# Patient Record
Sex: Female | Born: 2000 | Race: White | Hispanic: No | Marital: Single | Smoking: Current every day smoker
Health system: Southern US, Community
[De-identification: ages and names within clinical notes are randomized; demographics above are authoritative.]

---
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
## 2021-01-17 DIAGNOSIS — Z419 Encounter for procedure for purposes other than remedying health state, unspecified: Secondary | ICD-10-CM | POA: Diagnosis not present

## 2021-02-10 DIAGNOSIS — M791 Myalgia, unspecified site: Secondary | ICD-10-CM | POA: Diagnosis not present

## 2021-02-10 DIAGNOSIS — J069 Acute upper respiratory infection, unspecified: Secondary | ICD-10-CM | POA: Diagnosis not present

## 2021-02-10 DIAGNOSIS — R6883 Chills (without fever): Secondary | ICD-10-CM | POA: Diagnosis not present

## 2021-02-13 ENCOUNTER — Other Ambulatory Visit: Payer: Self-pay

## 2021-02-13 ENCOUNTER — Encounter (HOSPITAL_BASED_OUTPATIENT_CLINIC_OR_DEPARTMENT_OTHER): Payer: Self-pay | Admitting: *Deleted

## 2021-02-13 ENCOUNTER — Inpatient Hospital Stay (HOSPITAL_BASED_OUTPATIENT_CLINIC_OR_DEPARTMENT_OTHER)
Admission: EM | Admit: 2021-02-13 | Discharge: 2021-02-17 | DRG: 871 | Disposition: A | Discharge status: Home / Self Care | Payer: Medicaid Other | Attending: Internal Medicine | Admitting: Internal Medicine

## 2021-02-13 ENCOUNTER — Emergency Department (HOSPITAL_BASED_OUTPATIENT_CLINIC_OR_DEPARTMENT_OTHER): Payer: Medicaid Other

## 2021-02-13 DIAGNOSIS — F17210 Nicotine dependence, cigarettes, uncomplicated: Secondary | ICD-10-CM | POA: Diagnosis present

## 2021-02-13 DIAGNOSIS — E876 Hypokalemia: Secondary | ICD-10-CM | POA: Diagnosis not present

## 2021-02-13 DIAGNOSIS — Z20822 Contact with and (suspected) exposure to covid-19: Secondary | ICD-10-CM | POA: Diagnosis not present

## 2021-02-13 DIAGNOSIS — J9601 Acute respiratory failure with hypoxia: Secondary | ICD-10-CM | POA: Diagnosis not present

## 2021-02-13 DIAGNOSIS — R6521 Severe sepsis with septic shock: Secondary | ICD-10-CM | POA: Diagnosis not present

## 2021-02-13 DIAGNOSIS — R1031 Right lower quadrant pain: Secondary | ICD-10-CM | POA: Diagnosis not present

## 2021-02-13 DIAGNOSIS — E8721 Acute metabolic acidosis: Secondary | ICD-10-CM | POA: Diagnosis not present

## 2021-02-13 DIAGNOSIS — N10 Acute tubulo-interstitial nephritis: Secondary | ICD-10-CM | POA: Diagnosis not present

## 2021-02-13 DIAGNOSIS — B9620 Unspecified Escherichia coli [E. coli] as the cause of diseases classified elsewhere: Secondary | ICD-10-CM | POA: Diagnosis present

## 2021-02-13 DIAGNOSIS — D649 Anemia, unspecified: Secondary | ICD-10-CM | POA: Diagnosis not present

## 2021-02-13 DIAGNOSIS — F419 Anxiety disorder, unspecified: Secondary | ICD-10-CM | POA: Diagnosis present

## 2021-02-13 DIAGNOSIS — A419 Sepsis, unspecified organism: Secondary | ICD-10-CM | POA: Diagnosis not present

## 2021-02-13 DIAGNOSIS — N151 Renal and perinephric abscess: Secondary | ICD-10-CM | POA: Diagnosis present

## 2021-02-13 DIAGNOSIS — N12 Tubulo-interstitial nephritis, not specified as acute or chronic: Secondary | ICD-10-CM

## 2021-02-13 DIAGNOSIS — Z79890 Hormone replacement therapy (postmenopausal): Secondary | ICD-10-CM | POA: Diagnosis not present

## 2021-02-13 DIAGNOSIS — R0602 Shortness of breath: Secondary | ICD-10-CM

## 2021-02-13 DIAGNOSIS — E872 Acidosis, unspecified: Secondary | ICD-10-CM | POA: Diagnosis not present

## 2021-02-13 DIAGNOSIS — E860 Dehydration: Secondary | ICD-10-CM | POA: Diagnosis not present

## 2021-02-13 DIAGNOSIS — N3000 Acute cystitis without hematuria: Secondary | ICD-10-CM

## 2021-02-13 DIAGNOSIS — Z87440 Personal history of urinary (tract) infections: Secondary | ICD-10-CM | POA: Diagnosis not present

## 2021-02-13 DIAGNOSIS — R059 Cough, unspecified: Secondary | ICD-10-CM | POA: Diagnosis not present

## 2021-02-13 DIAGNOSIS — Z419 Encounter for procedure for purposes other than remedying health state, unspecified: Secondary | ICD-10-CM | POA: Diagnosis not present

## 2021-02-13 DIAGNOSIS — N179 Acute kidney failure, unspecified: Secondary | ICD-10-CM | POA: Diagnosis not present

## 2021-02-13 DIAGNOSIS — R509 Fever, unspecified: Secondary | ICD-10-CM

## 2021-02-13 LAB — LIPASE, BLOOD: Lipase: <10 U/L — ABNORMAL LOW (ref 11–51)

## 2021-02-13 LAB — COMPREHENSIVE METABOLIC PANEL
ALT: 19 U/L (ref 0–44)
AST: 19 U/L (ref 15–41)
Albumin: 3.7 g/dL (ref 3.5–5.0)
Alkaline Phosphatase: 57 U/L (ref 38–126)
Anion gap: 13 (ref 5–15)
BUN: 16 mg/dL (ref 6–20)
CO2: 22 mmol/L (ref 22–32)
Calcium: 9.4 mg/dL (ref 8.9–10.3)
Chloride: 97 mmol/L — ABNORMAL LOW (ref 98–111)
Creatinine, Ser: 1.28 mg/dL — ABNORMAL HIGH (ref 0.44–1.00)
GFR, Estimated: >60 mL/min (ref >60)
Glucose, Bld: 89 mg/dL (ref 70–99)
Potassium: 4 mmol/L (ref 3.5–5.1)
Sodium: 132 mmol/L — ABNORMAL LOW (ref 135–145)
Total Bilirubin: 0.5 mg/dL (ref 0.3–1.2)
Total Protein: 7 g/dL (ref 6.5–8.1)

## 2021-02-13 LAB — CBC WITH DIFFERENTIAL/PLATELET
Abs Immature Granulocytes: 0.15 10*3/uL — ABNORMAL HIGH (ref 0.00–0.07)
Basophils Absolute: 0.2 10*3/uL — ABNORMAL HIGH (ref 0.0–0.1)
Basophils Relative: 1 %
Eosinophils Absolute: 0 10*3/uL (ref 0.0–0.5)
Eosinophils Relative: 0 %
HCT: 36.7 % (ref 36.0–46.0)
Hemoglobin: 12.4 g/dL (ref 12.0–15.0)
Immature Granulocytes: 1 %
Lymphocytes Relative: 4 %
Lymphs Abs: 0.9 10*3/uL (ref 0.7–4.0)
MCH: 29.7 pg (ref 26.0–34.0)
MCHC: 33.8 g/dL (ref 30.0–36.0)
MCV: 87.8 fL (ref 80.0–100.0)
Monocytes Absolute: 2.2 10*3/uL — ABNORMAL HIGH (ref 0.1–1.0)
Monocytes Relative: 10 %
Neutro Abs: 18.2 10*3/uL — ABNORMAL HIGH (ref 1.7–7.7)
Neutrophils Relative %: 84 %
Platelets: 227 10*3/uL (ref 150–400)
RBC: 4.18 MIL/uL (ref 3.87–5.11)
RDW: 12.6 % (ref 11.5–15.5)
WBC: 21.5 10*3/uL — ABNORMAL HIGH (ref 4.0–10.5)
nRBC: 0 % (ref 0.0–0.2)

## 2021-02-13 LAB — URINALYSIS, ROUTINE W REFLEX MICROSCOPIC
Bilirubin Urine: NEGATIVE
Glucose, UA: NEGATIVE mg/dL
Ketones, ur: NEGATIVE mg/dL
Nitrite: NEGATIVE
Protein, ur: 100 mg/dL — AB
Specific Gravity, Urine: 1.018 (ref 1.005–1.030)
WBC, UA: >50 WBC/hpf — ABNORMAL HIGH (ref 0–5)
pH: 5.5 (ref 5.0–8.0)

## 2021-02-13 LAB — PREGNANCY, URINE: Preg Test, Ur: NEGATIVE

## 2021-02-13 LAB — RESP PANEL BY RT-PCR (FLU A&B, COVID) ARPGX2
Influenza A by PCR: NEGATIVE
Influenza B by PCR: NEGATIVE
SARS Coronavirus 2 by RT PCR: NEGATIVE

## 2021-02-13 LAB — LACTIC ACID, PLASMA
Lactic Acid, Venous: 1.1 mmol/L (ref 0.5–1.9)
Lactic Acid, Venous: 1.2 mmol/L (ref 0.5–1.9)

## 2021-02-13 LAB — MRSA NEXT GEN BY PCR, NASAL: MRSA by PCR Next Gen: NOT DETECTED

## 2021-02-13 LAB — HIV ANTIBODY (ROUTINE TESTING W REFLEX): HIV Screen 4th Generation wRfx: NONREACTIVE

## 2021-02-13 LAB — PROCALCITONIN: Procalcitonin: 11.31 ng/mL

## 2021-02-13 MED ORDER — MELATONIN 3 MG PO TABS
ORAL_TABLET | ORAL | Status: AC
  Filled 2021-02-13: qty 1

## 2021-02-13 MED ORDER — SODIUM CHLORIDE 0.9 % IV BOLUS
1000.0000 mL | Freq: Once | INTRAVENOUS | Status: AC
  Administered 2021-02-13: 14:00:00 1000 mL via INTRAVENOUS

## 2021-02-13 MED ORDER — ENSURE ENLIVE PO LIQD
237.0000 mL | Freq: Two times a day (BID) | ORAL | Status: DC
  Administered 2021-02-14: 237 mL via ORAL

## 2021-02-13 MED ORDER — ACETAMINOPHEN 325 MG PO TABS
650.0000 mg | ORAL_TABLET | Freq: Four times a day (QID) | ORAL | Status: DC | PRN
  Administered 2021-02-13 – 2021-02-14 (×4): 650 mg via ORAL
  Filled 2021-02-13 (×4): qty 2

## 2021-02-13 MED ORDER — KETOROLAC TROMETHAMINE 30 MG/ML IJ SOLN: 30.0000 mg | Freq: Four times a day (QID) | INTRAMUSCULAR | Status: DC | PRN

## 2021-02-13 MED ORDER — SORBITOL 70 % SOLN
30.0000 mL | Freq: Every day | Status: DC | PRN
  Filled 2021-02-13: qty 30

## 2021-02-13 MED ORDER — IOHEXOL 300 MG/ML  SOLN
75.0000 mL | Freq: Once | INTRAMUSCULAR | Status: AC | PRN
  Administered 2021-02-13: 13:00:00 75 mL via INTRAVENOUS

## 2021-02-13 MED ORDER — LACTATED RINGERS IV BOLUS (SEPSIS)
500.0000 mL | Freq: Once | INTRAVENOUS | Status: AC
  Administered 2021-02-13: 20:00:00 500 mL via INTRAVENOUS

## 2021-02-13 MED ORDER — SODIUM CHLORIDE 0.9 % IV BOLUS
1000.0000 mL | Freq: Once | INTRAVENOUS | Status: AC
  Administered 2021-02-13: 12:00:00 1000 mL via INTRAVENOUS

## 2021-02-13 MED ORDER — OXYCODONE HCL 5 MG PO TABS
5.0000 mg | ORAL_TABLET | ORAL | Status: DC | PRN
  Administered 2021-02-15: 5 mg via ORAL
  Filled 2021-02-13: qty 1

## 2021-02-13 MED ORDER — CHLORHEXIDINE GLUCONATE CLOTH 2 % EX PADS
6.0000 | MEDICATED_PAD | Freq: Every day | CUTANEOUS | Status: DC
  Administered 2021-02-13 – 2021-02-17 (×5): 6 via TOPICAL

## 2021-02-13 MED ORDER — IBUPROFEN 200 MG PO TABS
400.0000 mg | ORAL_TABLET | Freq: Four times a day (QID) | ORAL | Status: DC | PRN
  Administered 2021-02-13 – 2021-02-16 (×6): 400 mg via ORAL
  Filled 2021-02-13 (×5): qty 2
  Filled 2021-02-13: qty 1

## 2021-02-13 MED ORDER — LEVONORGESTREL-ETHINYL ESTRAD 0.1-20 MG-MCG PO TABS
1.0000 | ORAL_TABLET | Freq: Every morning | ORAL | Status: DC
  Administered 2021-02-15: 1 via ORAL

## 2021-02-13 MED ORDER — VANCOMYCIN HCL 1250 MG/250ML IV SOLN
1250.0000 mg | Freq: Once | INTRAVENOUS | Status: AC
  Administered 2021-02-13: 22:00:00 1250 mg via INTRAVENOUS
  Filled 2021-02-13 (×2): qty 250

## 2021-02-13 MED ORDER — SENNA 8.6 MG PO TABS
1.0000 | ORAL_TABLET | Freq: Two times a day (BID) | ORAL | Status: DC
  Administered 2021-02-13 – 2021-02-17 (×5): 8.6 mg via ORAL
  Filled 2021-02-13 (×7): qty 1

## 2021-02-13 MED ORDER — ENOXAPARIN SODIUM 40 MG/0.4ML IJ SOSY
40.0000 mg | PREFILLED_SYRINGE | INTRAMUSCULAR | Status: DC
  Administered 2021-02-13 – 2021-02-16 (×4): 40 mg via SUBCUTANEOUS
  Filled 2021-02-13 (×4): qty 0.4

## 2021-02-13 MED ORDER — SODIUM CHLORIDE 0.9 % IV SOLN
2.0000 g | Freq: Two times a day (BID) | INTRAVENOUS | Status: DC
  Administered 2021-02-13: 21:00:00 2 g via INTRAVENOUS
  Filled 2021-02-13 (×3): qty 2

## 2021-02-13 MED ORDER — ONDANSETRON HCL 4 MG PO TABS: 4.0000 mg | ORAL_TABLET | Freq: Four times a day (QID) | ORAL | Status: DC | PRN

## 2021-02-13 MED ORDER — FLEET ENEMA 7-19 GM/118ML RE ENEM: 1.0000 | ENEMA | Freq: Once | RECTAL | Status: DC | PRN

## 2021-02-13 MED ORDER — SODIUM CHLORIDE 0.9 % IV SOLN: 2.0000 g | INTRAVENOUS | Status: DC

## 2021-02-13 MED ORDER — ACETAMINOPHEN 650 MG RE SUPP: 650.0000 mg | Freq: Four times a day (QID) | RECTAL | Status: DC | PRN

## 2021-02-13 MED ORDER — LACTATED RINGERS IV BOLUS (SEPSIS)
250.0000 mL | Freq: Once | INTRAVENOUS | Status: AC
  Administered 2021-02-13: 21:00:00 250 mL via INTRAVENOUS

## 2021-02-13 MED ORDER — SODIUM CHLORIDE 0.9% FLUSH
3.0000 mL | Freq: Two times a day (BID) | INTRAVENOUS | Status: DC
  Administered 2021-02-15 – 2021-02-17 (×5): 3 mL via INTRAVENOUS

## 2021-02-13 MED ORDER — VANCOMYCIN HCL 500 MG/100ML IV SOLN
500.0000 mg | Freq: Two times a day (BID) | INTRAVENOUS | Status: DC
  Filled 2021-02-13: qty 100

## 2021-02-13 MED ORDER — POLYETHYLENE GLYCOL 3350 17 G PO PACK: 17.0000 g | PACK | Freq: Every day | ORAL | Status: DC | PRN

## 2021-02-13 MED ORDER — SODIUM CHLORIDE 0.9 % IV SOLN
1.0000 g | Freq: Once | INTRAVENOUS | Status: AC
  Administered 2021-02-13: 13:00:00 1 g via INTRAVENOUS
  Filled 2021-02-13: qty 10

## 2021-02-13 MED ORDER — METOPROLOL TARTRATE 5 MG/5ML IV SOLN: 5.0000 mg | Freq: Four times a day (QID) | INTRAVENOUS | Status: DC | PRN

## 2021-02-13 MED ORDER — ONDANSETRON HCL 4 MG/2ML IJ SOLN
4.0000 mg | Freq: Four times a day (QID) | INTRAMUSCULAR | Status: DC | PRN
  Administered 2021-02-15: 4 mg via INTRAVENOUS
  Filled 2021-02-13: qty 2

## 2021-02-13 MED ORDER — MELATONIN 3 MG PO TABS
3.0000 mg | ORAL_TABLET | Freq: Every day | ORAL | Status: DC
  Administered 2021-02-13 – 2021-02-16 (×4): 3 mg via ORAL
  Filled 2021-02-13 (×3): qty 1

## 2021-02-13 MED ORDER — SODIUM CHLORIDE 0.9 % IV SOLN: INTRAVENOUS | Status: DC

## 2021-02-13 MED ORDER — ACETAMINOPHEN 325 MG PO TABS
650.0000 mg | ORAL_TABLET | Freq: Once | ORAL | Status: AC
  Administered 2021-02-13: 11:00:00 650 mg via ORAL
  Filled 2021-02-13: qty 2

## 2021-02-13 MED ORDER — LACTATED RINGERS IV BOLUS (SEPSIS)
1000.0000 mL | Freq: Once | INTRAVENOUS | Status: AC
  Administered 2021-02-13: 19:00:00 1000 mL via INTRAVENOUS

## 2021-02-13 NOTE — ED Triage Notes (Signed)
Fever, headache, neck and back pain, occ nausea after taking meds.

## 2021-02-13 NOTE — ED Notes (Signed)
[facility] on unit to transport pt to [place] 

## 2021-02-13 NOTE — ED Provider Notes (Signed)
[facility] EMERGENCY DEPT Provider Note   CSN: [id number] Arrival date & time: 02/13/21  [id number]     History Chief Complaint  Patient presents with   Fever    Monica Goodman is a 20 y.o. female.  The history is provided by the patient.  Fever Temp source:  Subjective Severity:  Mild Onset quality:  Gradual Duration:  6 days Timing:  Intermittent Progression:  Waxing and waning Chronicity:  New Relieved by:  Ibuprofen Worsened by:  Nothing Associated symptoms: dysuria, headaches and myalgias   Associated symptoms: no chest pain, no chills, no confusion, no congestion, no cough, no diarrhea, no ear pain, no nausea, no rash, no sore throat and no vomiting   Risk factors: no sick contacts       History reviewed. No pertinent past medical history.  There are no problems to display for this patient.   History reviewed. No pertinent surgical history.   OB History   No obstetric history on file.     No family history on file.  Social History   Tobacco Use   Smoking status: Every Day    Types: Cigarettes   Smokeless tobacco: Never  Vaping Use   Vaping Use: Every day  Substance Use Topics   Alcohol use: Yes   Drug use: Never    Home Medications Prior to Admission medications   Not on File    Allergies    Patient has no known allergies.  Review of Systems   Review of Systems  Constitutional:  Positive for fever. Negative for chills.  HENT:  Negative for congestion, ear pain and sore throat.   Eyes:  Negative for pain and visual disturbance.  Respiratory:  Negative for cough and shortness of breath.   Cardiovascular:  Negative for chest pain and palpitations.  Gastrointestinal:  Positive for abdominal pain. Negative for diarrhea, nausea and vomiting.  Genitourinary:  Positive for dysuria. Negative for decreased urine volume, difficulty urinating, dyspareunia, enuresis, flank pain, frequency, genital sores, hematuria, pelvic pain, urgency,  vaginal discharge and vaginal pain.  Musculoskeletal:  Positive for myalgias. Negative for arthralgias and back pain.  Skin:  Negative for color change and rash.  Neurological:  Positive for headaches. Negative for seizures and syncope.  Psychiatric/Behavioral:  Negative for confusion.   All other systems reviewed and are negative.  Physical Exam Updated Vital Signs  ED Triage Vitals  Enc Vitals Group     BP 02/13/21 1032 102/60     Pulse Rate 02/13/21 1032 (!) 120     Resp 02/13/21 1032 12     Temp 02/13/21 1032 99 F (37.2 C)     Temp src --      SpO2 02/13/21 1032 99 %     Weight 02/13/21 1035 123 lb (55.8 kg)     Height 02/13/21 1035 5\' 2"  (1.575 m)     Head Circumference --      Peak Flow --      Pain Score 02/13/21 1035 7     Pain Loc --      Pain Edu? --      Excl. in [place]? --      Physical Exam Vitals and nursing note reviewed.  Constitutional:      General: She is not in acute distress.    Appearance: She is well-developed. She is not ill-appearing.  HENT:     Head: Normocephalic and atraumatic.     Nose: Congestion present. No rhinorrhea.     Mouth/Throat:  Mouth: Mucous membranes are moist.     Pharynx: No oropharyngeal exudate or posterior oropharyngeal erythema.  Eyes:     Extraocular Movements: Extraocular movements intact.     Conjunctiva/sclera: Conjunctivae normal.     Pupils: Pupils are equal, round, and reactive to light.  Cardiovascular:     Rate and Rhythm: Normal rate and regular rhythm.     Pulses: Normal pulses.     Heart sounds: Normal heart sounds. No murmur heard. Pulmonary:     Effort: Pulmonary effort is normal. No respiratory distress.     Breath sounds: Normal breath sounds.  Abdominal:     Palpations: Abdomen is soft.     Tenderness: There is abdominal tenderness (RLQ, sprapubic, right flank).  Musculoskeletal:        General: No swelling or tenderness.     Cervical back: Normal range of motion and neck supple.  Skin:     General: Skin is warm and dry.     Capillary Refill: Capillary refill takes less than 2 seconds.  Neurological:     General: No focal deficit present.     Mental Status: She is alert.  Psychiatric:        Mood and Affect: Mood normal.    ED Results / Procedures / Treatments   Labs (all labs ordered are listed, but only abnormal results are displayed) Labs Reviewed  URINALYSIS, ROUTINE W REFLEX MICROSCOPIC - Abnormal; Notable for the following components:      Result Value   APPearance CLOUDY (*)    Hgb urine dipstick MODERATE (*)    Protein, ur 100 (*)    Leukocytes,Ua LARGE (*)    WBC, UA >50 (*)    Bacteria, UA MANY (*)    All other components within normal limits  CBC WITH DIFFERENTIAL/PLATELET - Abnormal; Notable for the following components:   WBC 21.5 (*)    Neutro Abs 18.2 (*)    Monocytes Absolute 2.2 (*)    Basophils Absolute 0.2 (*)    Abs Immature Granulocytes 0.15 (*)    All other components within normal limits  COMPREHENSIVE METABOLIC PANEL - Abnormal; Notable for the following components:   Sodium 132 (*)    Chloride 97 (*)    Creatinine, Ser 1.28 (*)    All other components within normal limits  LIPASE, BLOOD - Abnormal; Notable for the following components:   Lipase <10 (*)    All other components within normal limits  RESP PANEL BY RT-PCR (FLU A&B, COVID) ARPGX2  URINE CULTURE  CULTURE, BLOOD (ROUTINE X 2)  CULTURE, BLOOD (ROUTINE X 2)  PREGNANCY, URINE  LACTIC ACID, PLASMA  LACTIC ACID, PLASMA    EKG None  Radiology CT ABDOMEN PELVIS W CONTRAST  Result Date: 02/13/2021 CLINICAL DATA:  RIGHT lower quadrant pain. Flank pain, kidney stone suspected. RIGHT lower quadrant abdominal pain, appendicitis suspected. EXAM: CT ABDOMEN AND PELVIS WITH CONTRAST TECHNIQUE: Multidetector CT imaging of the abdomen and pelvis was performed using the standard protocol following bolus administration of intravenous contrast. CONTRAST:  8mL OMNIPAQUE IOHEXOL 300  MG/ML  SOLN COMPARISON:  None. FINDINGS: Lower chest: No acute abnormality. Hepatobiliary: No focal liver abnormality is seen. Gallbladder appears normal. No bile duct dilatation is seen. Pancreas: Unremarkable. No pancreatic ductal dilatation or surrounding inflammatory changes. Spleen: Normal in size without focal abnormality. Adrenals/Urinary Tract: Adrenal glands are unremarkable. Ill-defined edema throughout the RIGHT renal cortex. More confluent hypodensity within the anterior cortex of the lower pole, measuring approximately 2 cm  greatest dimension, possible early developing abscess. Associated RIGHT perinephric fluid stranding/inflammation. LEFT kidney is unremarkable. No ureteral or bladder calculi are identified. Stomach/Bowel: No dilated large or small bowel loops are seen. No evidence of bowel wall inflammation. Majority of the appendix is obscured by surrounding bowel loops, but the small visualized portion of the appendix is unremarkable. Vascular/Lymphatic: No significant vascular findings are present. No enlarged abdominal or pelvic lymph nodes. Reproductive: Uterus and bilateral adnexa are unremarkable. Other: No intraperitoneal fluid collection or abscess-like collection is seen. No free intraperitoneal air. Musculoskeletal: No acute or significant osseous findings. IMPRESSION: 1. Acute pyelonephritis of the RIGHT kidney with associated edema throughout the RIGHT renal cortex and prominent perinephric fluid stranding/inflammation. More confluent hypodensity within the lower pole of the RIGHT renal cortex, measuring approximately 2 cm greatest dimension, which could represent confluent edema or possible associated early developing renal abscess collection. Consider short-term follow-up CT to exclude developing abscess. 2. No renal or ureteral calculi. 3. No bowel obstruction or evidence of bowel wall inflammation. No evidence of appendicitis. No free intraperitoneal air. Electronically Signed   By:  Franki Cabot M.D.   On: 02/13/2021 13:51   DG Chest Portable 1 View  Result Date: 02/13/2021 CLINICAL DATA:  Cough EXAM: PORTABLE CHEST 1 VIEW COMPARISON:  None. FINDINGS: The heart size and mediastinal contours are within normal limits. Both lungs are clear. The visualized skeletal structures are unremarkable. IMPRESSION: No active disease. Electronically Signed   By: Yetta Glassman M.D.   On: 02/13/2021 12:17    Procedures .Critical Care Performed by: Lennice Sites, DO Authorized by: Lennice Sites, DO   Critical care provider statement:    Critical care time (minutes):  35   Critical care was necessary to treat or prevent imminent or life-threatening deterioration of the following conditions:  Sepsis   Critical care was time spent personally by me on the following activities:  Blood draw for specimens, development of treatment plan with patient or surrogate, discussions with primary provider, evaluation of patient's response to treatment, examination of patient, obtaining history from patient or surrogate, ordering and performing treatments and interventions, ordering and review of laboratory studies, ordering and review of radiographic studies, pulse oximetry, re-evaluation of patient's condition and review of old charts   I assumed direction of critical care for this patient from another provider in my specialty: no     Care discussed with: admitting provider     Medications Ordered in ED Medications  sodium chloride 0.9 % bolus 1,000 mL (has no administration in time range)  acetaminophen (TYLENOL) tablet 650 mg (650 mg Oral Given 02/13/21 1118)  sodium chloride 0.9 % bolus 1,000 mL (0 mLs Intravenous Stopped 02/13/21 1356)  cefTRIAXone (ROCEPHIN) 1 g in sodium chloride 0.9 % 100 mL IVPB (0 g Intravenous Stopped 02/13/21 1315)  iohexol (OMNIPAQUE) 300 MG/ML solution 75 mL (75 mLs Intravenous Contrast Given 02/13/21 1314)    ED Course  I have reviewed the triage vital signs and  the nursing notes.  Pertinent labs & imaging results that were available during my care of the patient were reviewed by me and considered in my medical decision making (see chart for details).    MDM Rules/Calculators/A&P                           Monica Goodman is here with viral symptoms, fever, backache, stuffy nose.  Patient mildly tachycardic but otherwise normal vitals upon arrival.  Has had  fever on and off for the last 5 to 6 days.  Congestion, [place] be cough.  Also having some low back pain.  Originally evaluated for upper respiratory symptoms but viral testing was negative.  Chest x-ray was negative for infection.  Asked more about her low back pain and she did think that she was having some pain with urination/right flank pain.  She was tender in the lower abdomen.  Urinalysis was obtained that showed signs of infection and IV abx was given. Blood work was then obtained that showed a white count of 21 and mild AKI.  Concern for [place] be kidney stone or appendicitis on top of this but suspect likely pyelonephritis.  IV antibiotics have already been given with Rocephin.  However sepsis labs were initiated after lab work came back with white count of 21 and CT scan that showed pyelonephritis with significant surrounding edema.  There is significant edema but no obvious abscess.  Did recommend [place] be a short-term follow-up CT scan to exclude developing abscess.  Blood pressure in the low 90s.  Blood cultures have been added including lactic acid and additional fluid bolus.  Overall will admit to hospitalist for further care.  Antibiotics were given prior to blood cultures as sepsis was not on the differential until later in the visit.  Admitted in stable condition.  This chart was dictated using voice recognition software.  Despite best efforts to proofread,  errors can occur which can change the documentation meaning.   Final Clinical Impression(s) / ED Diagnoses Final diagnoses:  Sepsis, due to  unspecified organism, unspecified whether acute organ dysfunction present [facility])  Acute cystitis without hematuria  Pyelonephritis    Rx / DC Orders ED Discharge Orders     None        Lennice Sites, DO 02/13/21 1422

## 2021-02-13 NOTE — H&P (Addendum)
History and Physical    Monica Goodman [id number] DOB: 06/16/2000 DOA: 02/13/2021  PCP: Timothy Lasso, MD  Patient coming from: Home  I have personally briefly reviewed patient's old medical records in [facility]  Chief Complaint: Fever/lower back pain  HPI: Monica Goodman is a 20 y.o. female with no significant past medical history presented to the ED with a 6 to 7-day history of headache, myalgias, chills, fevers with a temp as high as 103.7 the day prior to admission, dark urine malodorous, right lower back pain, some shortness of breath, some chest pain to the mid upper abdominal region, some shallow breaths, some lightheadedness when standing up.  Patient does endorse some nausea with oral meds and 1 episode of emesis.  Tolerating oral intake.  Denies any melena, no hematemesis, no syncope.  No diarrhea, no constipation.  Tried some ibuprofen with no significant improvement and presented to the ED.    ED Course: Patient seen in the ED, urinalysis done with moderate hemoglobin, large leukocytes, nitrite negative, many bacteria, WBCs > 50.  CBC done noted a white count of 21.5 with a left shift.  Comprehensive metabolic profile with a sodium of 132, chloride of 97, creatinine of 1.28 otherwise was within normal limits.  Lipase level negative.  Lactic acid level at 1.1.  Chest x-ray done negative for any acute abnormalities.  CT abdomen and pelvis done consistent with acute pyelonephritis of the right kidney with associated edema throughout the right renal cortex, prominent perinephric fluid stranding/inflammation, more confluent hypodensity within the lower pole of the right renal cortex measuring approximately 2 cm could represent confluent edema or associated early developing renal abscess collection.  Consider short-term follow-up CT to exclude developing abscess.  No renal or ureteral calculi.  No bowel obstruction or evidence of bowel wall inflammation, no evidence of appendicitis, no free  intraperitoneal air.  COVID-19 PCR negative, influenza a and B PCR negative.  Patient noted on presentation to be tachycardic with heart rates in the 120s, tachypneic with respiratory rate of 22, blood pressure noted at 91/54 on presentation.  Patient given a dose of IV Rocephin.  Hospitalist called for admission.  [facility]  Review of Systems: As per HPI otherwise all other systems reviewed and are negative.  History reviewed. No pertinent past medical history.  History reviewed. No pertinent surgical history.  Social History  reports that she has been smoking cigarettes. She has never used smokeless tobacco. She reports current alcohol use. She reports that she does not use drugs.  No Known Allergies  Family History  Problem Relation Age of Onset   Arthritis Father    Mother alive age 66, and healthy.  Father alive age 1 with arthritis.  Prior to Admission medications   Medication Sig Start Date End Date Taking? Authorizing Provider  levonorgestrel-ethinyl estradiol (SRONYX) 0.1-20 MG-MCG tablet Take 1 tablet by mouth every morning.   Yes [provider]  oseltamivir (TAMIFLU) 75 MG capsule Take 75 mg by mouth 2 (two) times daily. 02/10/21   [provider]    Physical Exam: Vitals:   02/13/21 1600 02/13/21 1630 02/13/21 1642 02/13/21 1714  BP: 108/73 102/68  (!) 107/57  Pulse: (!) 107 98  (!) 108  Resp: 17 (!) 22  17  Temp:   98.6 F (37 C) 98.7 F (37.1 C)  TempSrc:   Oral Oral  SpO2: 100% 100%  100%  Weight:      Height:  Constitutional: NAD, calm, comfortable.  Dry mucous membranes.  Patient visibly shaking in bed with chills. Vitals:   02/13/21 1600 02/13/21 1630 02/13/21 1642 02/13/21 1714  BP: 108/73 102/68  (!) 107/57  Pulse: (!) 107 98  (!) 108  Resp: 17 (!) 22  17  Temp:   98.6 F (37 C) 98.7 F (37.1 C)  TempSrc:   Oral Oral  SpO2: 100% 100%  100%  Weight:      Height:        Eyes: PERRL, lids and conjunctivae normal ENMT: Mucous membranes are dry. Posterior pharynx clear of any exudate or lesions.Normal dentition.  Neck: normal, supple, no masses, no thyromegaly Respiratory: clear to auscultation bilaterally, no wheezing, no crackles. Normal respiratory effort. No accessory muscle use.  Cardiovascular: Tachycardia.  No murmurs rubs or gallops.  No JVD.  No lower extremity edema.  Abdomen: Soft, nondistended, positive bowel sounds, tender to palpation in the right suprapubic region, right CVA tenderness to palpation.  Positive bowel sounds.  No rebound.  No guarding.  Musculoskeletal: no clubbing / cyanosis. No joint deformity upper and lower extremities. Good ROM, no contractures. Normal muscle tone.  Skin: no rashes, lesions, ulcers. No induration Neurologic: CN 2-12 grossly intact. Sensation intact, DTR normal. Strength 5/5 in all 4.  Psychiatric: Normal judgment and insight. Alert and oriented x 3. Normal mood.   Labs on Admission: I have personally reviewed following labs and imaging studies  CBC: Recent Labs  Lab 02/13/21 1200  WBC 21.5*  NEUTROABS 18.2*  HGB 12.4  HCT 36.7  MCV 87.8  PLT 227    Basic Metabolic Panel: Recent Labs  Lab 02/13/21 1200  NA 132*  K 4.0  CL 97*  CO2 22  GLUCOSE 89  BUN 16  CREATININE 1.28*  CALCIUM 9.4    GFR: Estimated Creatinine Clearance: 55.9 mL/min (A) (by C-G formula based on SCr of 1.28 mg/dL (H)).  Liver Function Tests: Recent Labs  Lab 02/13/21 1200  AST 19  ALT 19  ALKPHOS 57  BILITOT 0.5  PROT 7.0  ALBUMIN 3.7    Urine analysis:    Component Value Date/Time   COLORURINE YELLOW 02/13/2021 1203   APPEARANCEUR CLOUDY (A) 02/13/2021 1203   LABSPEC 1.018 02/13/2021 1203   PHURINE 5.5 02/13/2021 1203   GLUCOSEU NEGATIVE 02/13/2021 1203   HGBUR MODERATE (A) 02/13/2021 1203   BILIRUBINUR NEGATIVE 02/13/2021 1203   KETONESUR NEGATIVE 02/13/2021 1203   PROTEINUR 100 (A) 02/13/2021 1203    NITRITE NEGATIVE 02/13/2021 1203   LEUKOCYTESUR LARGE (A) 02/13/2021 1203    Radiological Exams on Admission: CT ABDOMEN PELVIS W CONTRAST  Result Date: 02/13/2021 CLINICAL DATA:  RIGHT lower quadrant pain. Flank pain, kidney stone suspected. RIGHT lower quadrant abdominal pain, appendicitis suspected. EXAM: CT ABDOMEN AND PELVIS WITH CONTRAST TECHNIQUE: Multidetector CT imaging of the abdomen and pelvis was performed using the standard protocol following bolus administration of intravenous contrast. CONTRAST:  56mL OMNIPAQUE IOHEXOL 300 MG/ML  SOLN COMPARISON:  None. FINDINGS: Lower chest: No acute abnormality. Hepatobiliary: No focal liver abnormality is seen. Gallbladder appears normal. No bile duct dilatation is seen. Pancreas: Unremarkable. No pancreatic ductal dilatation or surrounding inflammatory changes. Spleen: Normal in size without focal abnormality. Adrenals/Urinary Tract: Adrenal glands are unremarkable. Ill-defined edema throughout the RIGHT renal cortex. More confluent hypodensity within the anterior cortex of the lower pole, measuring approximately 2 cm greatest dimension, possible early developing abscess. Associated RIGHT perinephric fluid stranding/inflammation. LEFT kidney is unremarkable. No ureteral or  bladder calculi are identified. Stomach/Bowel: No dilated large or small bowel loops are seen. No evidence of bowel wall inflammation. Majority of the appendix is obscured by surrounding bowel loops, but the small visualized portion of the appendix is unremarkable. Vascular/Lymphatic: No significant vascular findings are present. No enlarged abdominal or pelvic lymph nodes. Reproductive: Uterus and bilateral adnexa are unremarkable. Other: No intraperitoneal fluid collection or abscess-like collection is seen. No free intraperitoneal air. Musculoskeletal: No acute or significant osseous findings. IMPRESSION: 1. Acute pyelonephritis of the RIGHT kidney with associated edema throughout  the RIGHT renal cortex and prominent perinephric fluid stranding/inflammation. More confluent hypodensity within the lower pole of the RIGHT renal cortex, measuring approximately 2 cm greatest dimension, which could represent confluent edema or possible associated early developing renal abscess collection. Consider short-term follow-up CT to exclude developing abscess. 2. No renal or ureteral calculi. 3. No bowel obstruction or evidence of bowel wall inflammation. No evidence of appendicitis. No free intraperitoneal air. Electronically Signed   By: Bary Richard M.D.   On: 02/13/2021 13:51   DG Chest Portable 1 View  Result Date: 02/13/2021 CLINICAL DATA:  Cough EXAM: PORTABLE CHEST 1 VIEW COMPARISON:  None. FINDINGS: The heart size and mediastinal contours are within normal limits. Both lungs are clear. The visualized skeletal structures are unremarkable. IMPRESSION: No active disease. Electronically Signed   By: Allegra Lai M.D.   On: 02/13/2021 12:17    EKG: Not done  Assessment/Plan Principal Problem:   Sepsis [facility]) Active Problems:   Pyelonephritis   Dehydration   #1 sepsis secondary to acute right-sided pyelonephritis/probable early developing abscess. -Patient presenting with fever(103.7 the morning of admission), chills, dark urine, right flank pain, noted to be tachycardic on admission, tachypneic, leukocytosis, urinalysis concerning for UTI, CT abdomen and pelvis consistent with right acute pyelonephritis with probable early developing abscess.  Patient noted on presentation to the ED to have a blood pressure of 91/54. -Blood cultures ordered and pending.  Urine cultures pending. -Place on Rocephin 2 g IV daily pending culture results. -Transfer to telemetry floor. -1 L IV fluid bolus. -Normal saline 125 cc an hour. -We will likely need repeat imaging of the abdomen and pelvis if no significant clinical improvement with ongoing fevers to rule out abscess formation. -Consult  with urology. Addendum 8:03 PM 02/13/2021 -Patient with ongoing fevers with temperatures of 103, tachycardia with heart rates in the 130s, blood pressure borderline of 101/46.  We will transfer to stepdown unit for closer monitoring.  Broaden IV antibiotic coverage to IV vancomycin and cefepime.  Discontinue IV Rocephin.  Supportive care.  2.  Dehydration IV fluids.   DVT prophylaxis: Lovenox Code Status:   Full Family Communication:  Updated patient.  No family at bedside. Disposition Plan:   Patient is from:  Home  Anticipated DC to:  Home  Anticipated DC date:  3 to 4 days  Anticipated DC barriers: Clinical improvement  Consults called:  None Admission status:  Admit to inpatient/telemetry.  Severity of Illness: The appropriate patient status for this patient is INPATIENT. Inpatient status is judged to be reasonable and necessary in order to provide the required intensity of service to ensure the patient's safety. The patient's presenting symptoms, physical exam findings, and initial radiographic and laboratory data in the context of their chronic comorbidities is felt to place them at high risk for further clinical deterioration. Furthermore, it is not anticipated that the patient will be medically stable for discharge from the hospital within 2 midnights  of admission.   * I certify that at the point of admission it is my clinical judgment that the patient will require inpatient hospital care spanning beyond 2 midnights from the point of admission due to high intensity of service, high risk for further deterioration and high frequency of surveillance required.*    Ramiro Harvest MD [facility]  How to contact the [facility] Attending or Consulting provider 7A - 7P or covering provider during after hours 7P -7A, for this patient?   Check the care team in [facility] and look for a) attending/consulting [facility] provider listed and b) the [facility] team listed Log into [email or web address] and use [facility]'s universal password to access. If you do not have the password, please contact the hospital operator. Locate the [facility] provider you are looking for under [facility] and page to a number that you can be directly reached. If you still have difficulty reaching the provider, please page the [facility] (Director on Call) for the Hospitalists listed on amion for assistance.  02/13/2021, 6:30 PM

## 2021-02-13 NOTE — Progress Notes (Signed)
Pharmacy Antibiotic Note  Joyice Fogelman is a 20 y.o. female admitted on 02/13/2021 with  pyelonephritis .  Pharmacy has been consulted for vancomycin & cefepime dosing. UA +; CT A/P: Acute pyelonephritis of the RIGHT kidney  Plan: Vancomycin loading dose 1250 mg followed by vancomycin 500 mg IV q12 for est AUC 489.9 using SCr 1.28 Cefepime 2 gm IV q12 hr Rec change to ceftriaxone 2 gm IV q24  Height: 5\' 2"  (157.5 cm) Weight: 55.8 kg (123 lb) IBW/kg (Calculated) : 50.1  Temp (24hrs), Avg:100.4 F (38 C), Min:98.6 F (37 C), Max:103 F (39.4 C)  Recent Labs  Lab 02/13/21 1200 02/13/21 1401 02/13/21 1542  WBC 21.5*  --   --   CREATININE 1.28*  --   --   LATICACIDVEN  --  1.1 1.2    Estimated Creatinine Clearance: 55.9 mL/min (A) (by C-G formula based on SCr of 1.28 mg/dL (H)).    No Known Allergies  Antimicrobials this admission: 11/28 CTX x 1 11/28 Vanc>> 11/28 Cefepime>> Dose adjustments this admission:  Microbiology results: 11/28 BCx2: sent 11/28 UCx: sent  Thank you for allowing pharmacy to be a part of this patient's care.  12/28, Pharm.D [phone] 02/13/2021 8:59 PM

## 2021-02-13 NOTE — ED Notes (Signed)
Patient transported to CT 

## 2021-02-13 NOTE — Progress Notes (Signed)
20 year old female presented at [facility] with complaints of fever, stuffy nose.  She was tachycardic on presentation.  She was also complaining of low back pain.  UA was suspicious for UTI.  Blood work showed leukocytosis, mild AKI.  CT scan showed right-sided pyelonephritis with significant surrounding edema/early abscess.  Patient being admitted for management of sepsis secondary to pyonephritis.

## 2021-02-13 NOTE — Treatment Plan (Addendum)
Received page to urology pager while scrubbed into OR. Unable to see patient at that time, but imaging reviewed. Full consult note to follow.   Reviewed patient's imaging - no signs of obstruction, no stones or hydro. No acute urologic intervention indicated now.  Can place foley catheter while febrile for bladder decompression.

## 2021-02-13 NOTE — Progress Notes (Signed)
Patient under sepsis protocol. Will be transferred to tele floor per Dr. Janee Morn when bed is available.

## 2021-02-14 ENCOUNTER — Inpatient Hospital Stay (HOSPITAL_COMMUNITY): Payer: Medicaid Other

## 2021-02-14 ENCOUNTER — Encounter (HOSPITAL_COMMUNITY): Payer: Self-pay

## 2021-02-14 DIAGNOSIS — R0602 Shortness of breath: Secondary | ICD-10-CM | POA: Diagnosis not present

## 2021-02-14 DIAGNOSIS — R0789 Other chest pain: Secondary | ICD-10-CM | POA: Diagnosis not present

## 2021-02-14 DIAGNOSIS — N12 Tubulo-interstitial nephritis, not specified as acute or chronic: Secondary | ICD-10-CM | POA: Diagnosis not present

## 2021-02-14 DIAGNOSIS — R6521 Severe sepsis with septic shock: Secondary | ICD-10-CM

## 2021-02-14 DIAGNOSIS — D649 Anemia, unspecified: Secondary | ICD-10-CM | POA: Diagnosis not present

## 2021-02-14 DIAGNOSIS — N151 Renal and perinephric abscess: Secondary | ICD-10-CM | POA: Diagnosis not present

## 2021-02-14 DIAGNOSIS — N179 Acute kidney failure, unspecified: Secondary | ICD-10-CM | POA: Diagnosis not present

## 2021-02-14 DIAGNOSIS — A419 Sepsis, unspecified organism: Principal | ICD-10-CM | POA: Diagnosis present

## 2021-02-14 DIAGNOSIS — E876 Hypokalemia: Secondary | ICD-10-CM

## 2021-02-14 DIAGNOSIS — Z20822 Contact with and (suspected) exposure to covid-19: Secondary | ICD-10-CM | POA: Diagnosis not present

## 2021-02-14 DIAGNOSIS — N10 Acute tubulo-interstitial nephritis: Secondary | ICD-10-CM | POA: Diagnosis not present

## 2021-02-14 DIAGNOSIS — E872 Acidosis, unspecified: Secondary | ICD-10-CM

## 2021-02-14 DIAGNOSIS — N3000 Acute cystitis without hematuria: Secondary | ICD-10-CM | POA: Diagnosis not present

## 2021-02-14 DIAGNOSIS — E8721 Acute metabolic acidosis: Secondary | ICD-10-CM | POA: Diagnosis not present

## 2021-02-14 DIAGNOSIS — J9601 Acute respiratory failure with hypoxia: Secondary | ICD-10-CM | POA: Diagnosis not present

## 2021-02-14 DIAGNOSIS — J90 Pleural effusion, not elsewhere classified: Secondary | ICD-10-CM | POA: Diagnosis not present

## 2021-02-14 LAB — CBC WITH DIFFERENTIAL/PLATELET
Abs Immature Granulocytes: 0.14 10*3/uL — ABNORMAL HIGH (ref 0.00–0.07)
Basophils Absolute: 0.1 10*3/uL (ref 0.0–0.1)
Basophils Relative: 1 %
Eosinophils Absolute: 0 10*3/uL (ref 0.0–0.5)
Eosinophils Relative: 0 %
HCT: 29.5 % — ABNORMAL LOW (ref 36.0–46.0)
Hemoglobin: 9.8 g/dL — ABNORMAL LOW (ref 12.0–15.0)
Immature Granulocytes: 1 %
Lymphocytes Relative: 10 %
Lymphs Abs: 1.5 10*3/uL (ref 0.7–4.0)
MCH: 30.7 pg (ref 26.0–34.0)
MCHC: 33.2 g/dL (ref 30.0–36.0)
MCV: 92.5 fL (ref 80.0–100.0)
Monocytes Absolute: 1.6 10*3/uL — ABNORMAL HIGH (ref 0.1–1.0)
Monocytes Relative: 11 %
Neutro Abs: 11.8 10*3/uL — ABNORMAL HIGH (ref 1.7–7.7)
Neutrophils Relative %: 77 %
Platelets: 185 10*3/uL (ref 150–400)
RBC: 3.19 MIL/uL — ABNORMAL LOW (ref 3.87–5.11)
RDW: 13 % (ref 11.5–15.5)
WBC: 15.2 10*3/uL — ABNORMAL HIGH (ref 4.0–10.5)
nRBC: 0 % (ref 0.0–0.2)

## 2021-02-14 LAB — IRON AND TIBC
Iron: 8 ug/dL — ABNORMAL LOW (ref 28–170)
Saturation Ratios: 4 % — ABNORMAL LOW (ref 10.4–31.8)
TIBC: 207 ug/dL — ABNORMAL LOW (ref 250–450)
UIBC: 199 ug/dL

## 2021-02-14 LAB — FOLATE: Folate: 7.9 ng/mL (ref >5.9)

## 2021-02-14 LAB — COMPREHENSIVE METABOLIC PANEL
ALT: 18 U/L (ref 0–44)
AST: 18 U/L (ref 15–41)
Albumin: 2.1 g/dL — ABNORMAL LOW (ref 3.5–5.0)
Alkaline Phosphatase: 56 U/L (ref 38–126)
Anion gap: 9 (ref 5–15)
BUN: 11 mg/dL (ref 6–20)
CO2: 17 mmol/L — ABNORMAL LOW (ref 22–32)
Calcium: 7.8 mg/dL — ABNORMAL LOW (ref 8.9–10.3)
Chloride: 110 mmol/L (ref 98–111)
Creatinine, Ser: 0.75 mg/dL (ref 0.44–1.00)
GFR, Estimated: >60 mL/min (ref >60)
Glucose, Bld: 119 mg/dL — ABNORMAL HIGH (ref 70–99)
Potassium: 3.2 mmol/L — ABNORMAL LOW (ref 3.5–5.1)
Sodium: 136 mmol/L (ref 135–145)
Total Bilirubin: 0.4 mg/dL (ref 0.3–1.2)
Total Protein: 5 g/dL — ABNORMAL LOW (ref 6.5–8.1)

## 2021-02-14 LAB — VITAMIN B12: Vitamin B-12: 688 pg/mL (ref 180–914)

## 2021-02-14 LAB — GLUCOSE, CAPILLARY: Glucose-Capillary: 102 mg/dL — ABNORMAL HIGH (ref 70–99)

## 2021-02-14 LAB — FERRITIN: Ferritin: 318 ng/mL — ABNORMAL HIGH (ref 11–307)

## 2021-02-14 LAB — PROTIME-INR
INR: 1.2 (ref 0.8–1.2)
Prothrombin Time: 15.2 seconds (ref 11.4–15.2)

## 2021-02-14 LAB — MAGNESIUM: Magnesium: 1.8 mg/dL (ref 1.7–2.4)

## 2021-02-14 LAB — PHOSPHORUS: Phosphorus: 3.1 mg/dL (ref 2.5–4.6)

## 2021-02-14 MED ORDER — ALBUMIN HUMAN 25 % IV SOLN
25.0000 g | Freq: Once | INTRAVENOUS | Status: AC
  Administered 2021-02-14: 25 g via INTRAVENOUS

## 2021-02-14 MED ORDER — SODIUM CHLORIDE 0.9 % IV SOLN
500.0000 mL | Freq: Once | INTRAVENOUS | Status: AC
  Administered 2021-02-14: 500 mL via INTRAVENOUS

## 2021-02-14 MED ORDER — STERILE WATER FOR INJECTION IV SOLN
INTRAVENOUS | Status: DC
  Filled 2021-02-14 (×2): qty 150

## 2021-02-14 MED ORDER — MAGNESIUM SULFATE 2 GM/50ML IV SOLN: 2.0000 g | Freq: Once | INTRAVENOUS | Status: DC

## 2021-02-14 MED ORDER — ALBUMIN HUMAN 25 % IV SOLN
INTRAVENOUS | Status: AC
  Filled 2021-02-14: qty 100

## 2021-02-14 MED ORDER — LACTATED RINGERS IV BOLUS
500.0000 mL | Freq: Once | INTRAVENOUS | Status: AC
  Administered 2021-02-14: 500 mL via INTRAVENOUS

## 2021-02-14 MED ORDER — FUROSEMIDE 10 MG/ML IJ SOLN
INTRAMUSCULAR | Status: AC
  Filled 2021-02-14: qty 2

## 2021-02-14 MED ORDER — SODIUM CHLORIDE 0.9 % IV BOLUS
500.0000 mL | Freq: Once | INTRAVENOUS | Status: AC
  Administered 2021-02-14: 500 mL via INTRAVENOUS

## 2021-02-14 MED ORDER — POTASSIUM CHLORIDE 20 MEQ PO PACK
PACK | ORAL | Status: AC
  Filled 2021-02-14: qty 1

## 2021-02-14 MED ORDER — ADULT MULTIVITAMIN W/MINERALS CH
ORAL_TABLET | ORAL | Status: AC
  Filled 2021-02-14: qty 1

## 2021-02-14 MED ORDER — ADULT MULTIVITAMIN W/MINERALS CH
1.0000 | ORAL_TABLET | Freq: Every day | ORAL | Status: DC
  Administered 2021-02-14 – 2021-02-17 (×4): 1 via ORAL
  Filled 2021-02-14 (×3): qty 1

## 2021-02-14 MED ORDER — FUROSEMIDE 10 MG/ML IJ SOLN
20.0000 mg | Freq: Once | INTRAMUSCULAR | Status: AC
  Administered 2021-02-14: 20 mg via INTRAVENOUS

## 2021-02-14 MED ORDER — VANCOMYCIN HCL 750 MG/150ML IV SOLN
750.0000 mg | Freq: Two times a day (BID) | INTRAVENOUS | Status: DC
Start: 2021-02-14 — End: 2021-02-15
  Administered 2021-02-14 (×2): 750 mg via INTRAVENOUS
  Filled 2021-02-14 (×3): qty 150

## 2021-02-14 MED ORDER — POTASSIUM CHLORIDE CRYS ER 10 MEQ PO TBCR
40.0000 meq | EXTENDED_RELEASE_TABLET | Freq: Once | ORAL | Status: AC
  Administered 2021-02-14: 40 meq via ORAL

## 2021-02-14 MED ORDER — MAGNESIUM SULFATE 2 GM/50ML IV SOLN
INTRAVENOUS | Status: AC
  Filled 2021-02-14: qty 50

## 2021-02-14 MED ORDER — POTASSIUM CHLORIDE CRYS ER 20 MEQ PO TBCR
EXTENDED_RELEASE_TABLET | ORAL | Status: AC
  Filled 2021-02-14: qty 2

## 2021-02-14 MED ORDER — SODIUM CHLORIDE 0.9 % IV SOLN
2.0000 g | Freq: Three times a day (TID) | INTRAVENOUS | Status: DC
  Administered 2021-02-14 – 2021-02-15 (×4): 2 g via INTRAVENOUS
  Filled 2021-02-14: qty 2

## 2021-02-14 MED ORDER — POTASSIUM CHLORIDE CRYS ER 20 MEQ PO TBCR
40.0000 meq | EXTENDED_RELEASE_TABLET | Freq: Once | ORAL | Status: AC
  Administered 2021-02-14: 40 meq via ORAL

## 2021-02-14 MED ORDER — MAGNESIUM SULFATE 2 GM/50ML IV SOLN
2.0000 g | Freq: Once | INTRAVENOUS | Status: AC
Start: 2021-02-14 — End: 2021-02-14
  Administered 2021-02-14: 2 g via INTRAVENOUS

## 2021-02-14 NOTE — Consult Note (Signed)
NAMEAmayrany Goodman, MRN:  [id number], DOB:  Oct 07, 2000, LOS: 1 ADMISSION DATE:  02/13/2021, CONSULTATION DATE:  02/14/21 REFERRING MD:  Grandville Silos CHIEF COMPLAINT:  Flank pain   History of Present Illness:  Monica Goodman is a 20 y.o. female who has no known significant PMH.  She presented to [facility] on 11/28 with 6 to 7 days of fevers as high as 103.59F, chills, myalgias, headache.  1 day prior to that she noted dark-colored urine that was a little malodorous as well as new onset of right flank pain.  No similar episodes in the past.  Has had 1 UTI previously that was uncomplicated.  No further urinary or known kidney problems.  In ED, UA was dirty.  CT of the abdomen and pelvis revealed acute pyelonephritis on the right with possible edema versus abscess collection.  She was subsequently transferred to [facility] under [place] and was started on fluids and Rocephin.  Overnight, she had persistent low blood pressure and fever.  Antibiotics were broadened to Vancomycin and Cefepime.  PCCM asked to see in consultation AM 11/29 for borderline BP.  At the time of my evaluation around 11 AM, vitals were stable with normal blood pressure.  Last 6 readings had MAP greater than 72.  Per RN, she has not had any soft pressures this morning and besides 1 episode of a sustained sinus tachycardia, she has been fairly stable.  She had been seen by urology earlier in the morning who recommended continued antibiotics and follow-up CT in 2 to 3 days if no significant improvement.  Pertinent  Medical History:  has Pyelonephritis; Sepsis ([place]); Dehydration; Acute cystitis without hematuria; Hypokalemia; Anemia; Metabolic acidosis; and Severe sepsis with septic shock ([place]) on their problem list.  Significant Hospital Events: Including procedures, antibiotic start and stop dates in addition to other pertinent events   11/28 admit. 11/29 PCCM consult.  Interim History / Subjective:  Stable with MAP 75.  Has ongoing flank pain and  intermittent sweats/fever. Tmax 102 here.  Objective:  Blood pressure (!) 103/57, pulse 93, temperature 98 F (36.7 C), temperature source Axillary, resp. rate 17, height 5\' 2"  (1.575 m), weight 56.1 kg, last menstrual period 01/30/2021, SpO2 94 %.        Intake/Output Summary (Last 24 hours) at 02/14/2021 1237 Last data filed at 02/14/2021 1107 Gross per 24 hour  Intake 3798.62 ml  Output 900 ml  Net 2898.62 ml   Filed Weights   02/13/21 1035 02/14/21 0500  Weight: 55.8 kg 56.1 kg    Examination: General: Young female, resting in bed, in NAD. Neuro: A&O x 3, no deficits. HEENT: [place]/AT. Sclerae anicteric. EOMI. Cardiovascular: RRR, no M/R/G.  Lungs: Respirations even and unlabored.  CTA bilaterally, No W/R/R.  Abdomen: BS x 4, tenderness RLQ and right flank. Musculoskeletal: No gross deformities, no edema.  Skin: Intact, warm, no rashes.  Labs/imaging personally reviewed:  CT A/P 11/29 > pyelo on the right with associated edema.  Possible confluent edema versus early developing renal abscess.  Assessment & Plan:   Right pyelonephritis - CT with concern for confluent edema vs developing abscess. - Continue Vanc / Cefepime. - Urology following, recommending repeat CT in 2 - 3 days is no improvement. - Other option is to consider renal US.  Hypotension - improved with fluids.  Suspect multifactorial in setting infection + dehydration. - Continue fluids and supportive care.   Continue management as already doing and currently ordered.  Repeat CT vs US kidney in  2 - days.  Urology following as well. Rest per primary team.  Nothing further to add.  PCCM will sign off.  Please call us back if we can be of any further assistance.   Best practice (evaluated daily):  Per primary.  Labs   CBC: Recent Labs  Lab 02/13/21 1200 02/14/21 0246  WBC 21.5* 15.2*  NEUTROABS 18.2* 11.8*  HGB 12.4 9.8*  HCT 36.7 29.5*  MCV 87.8 92.5  PLT 227 185    Basic Metabolic  Panel: Recent Labs  Lab 02/13/21 1200 02/14/21 0246  NA 132* 136  K 4.0 3.2*  CL 97* 110  CO2 22 17*  GLUCOSE 89 119*  BUN 16 11  CREATININE 1.28* 0.75  CALCIUM 9.4 7.8*  MG  --  1.8  PHOS  --  3.1   GFR: Estimated Creatinine Clearance: 89.5 mL/min (by C-G formula based on SCr of 0.75 mg/dL). Recent Labs  Lab 02/13/21 1200 02/13/21 1401 02/13/21 1542 02/13/21 1916 02/14/21 0246  PROCALCITON  --   --   --  11.31  --   WBC 21.5*  --   --   --  15.2*  LATICACIDVEN  --  1.1 1.2  --   --     Liver Function Tests: Recent Labs  Lab 02/13/21 1200 02/14/21 0246  AST 19 18  ALT 19 18  ALKPHOS 57 56  BILITOT 0.5 0.4  PROT 7.0 5.0*  ALBUMIN 3.7 2.1*   Recent Labs  Lab 02/13/21 1200  LIPASE <10*   No results for input(s): AMMONIA in the last 168 hours.  ABG No results found for: PHART, PCO2ART, PO2ART, HCO3, TCO2, ACIDBASEDEF, O2SAT   Coagulation Profile: Recent Labs  Lab 02/14/21 0246  INR 1.2    Cardiac Enzymes: No results for input(s): CKTOTAL, CKMB, CKMBINDEX, TROPONINI in the last 168 hours.  HbA1C: No results found for: HGBA1C  CBG: Recent Labs  Lab 02/14/21 0831  GLUCAP 102*    Review of Systems:   All negative; except for those that are bolded, which indicate positives.  Constitutional: weight loss, weight gain, night sweats, fevers, chills, fatigue, weakness.  HEENT: headaches, sore throat, sneezing, nasal congestion, post nasal drip, difficulty swallowing, tooth/dental problems, visual complaints, visual changes, ear aches. Neuro: difficulty with speech, weakness, numbness, ataxia. CV:  chest pain, orthopnea, PND, swelling in lower extremities, dizziness, palpitations, syncope.  Resp: cough, hemoptysis, dyspnea, wheezing. GI: heartburn, indigestion, abdominal pain RLQ, nausea, vomiting, diarrhea, constipation, change in bowel habits, loss of appetite, hematemesis, melena, hematochezia.  GU: dysuria, change in color of urine, urgency or  frequency, flank pain, hematuria. MSK: joint pain or swelling, decreased range of motion. Psych: change in mood or affect, depression, anxiety, suicidal ideations, homicidal ideations. Skin: rash, itching, bruising.   Past Medical History:  She,  has no past medical history on file.   Surgical History:  History reviewed. No pertinent surgical history.   Social History:   reports that she has been smoking cigarettes. She has never used smokeless tobacco. She reports current alcohol use. She reports that she does not use drugs.   Family History:  Her family history includes Arthritis in her father.   Allergies No Known Allergies   Home Medications  Prior to Admission medications   Medication Sig Start Date End Date Taking? Authorizing Provider  Camphor-Eucalyptus-Menthol (VICKS VAPORUB EX) Apply 1 application topically See admin instructions. Apply topically to chest, upper lip, lower back up to three times daily as needed for cough/congestion  Yes [provider]  Cholecalciferol (VITAMIN D3 PO) Take 1 tablet by mouth daily.   Yes [provider]  guaiFENesin (MUCINEX PO) Take 20 mLs by mouth every 4 (four) hours as needed (cough/congestion).   Yes [provider]  ibuprofen (ADVIL) 200 MG tablet Take 600 mg by mouth every 6 (six) hours as needed for fever or headache (pain).   Yes [provider]  levonorgestrel-ethinyl estradiol (ALESSE) 0.1-20 MG-MCG tablet Take 1 tablet by mouth every morning.   Yes [provider]  oseltamivir (TAMIFLU) 75 MG capsule Take 75 mg by mouth 2 (two) times daily. 02/10/21  Yes [provider]      Montey Hora, [place] For pager details, please see AMION or use Epic chat  After 1900, please call [facility] for cross coverage needs 02/14/2021, 12:37 PM

## 2021-02-14 NOTE — Progress Notes (Signed)
    OVERNIGHT PROGRESS REPORT  Notified by RN for Hypotension. Patient has had an improvement in temperature, RR, HR, and continues to be A&O x 4.  She has only had 1 (one) unmeasured urine occurrence since arrival to ICU/SDU.  Patient appears in no obvious or stated distress and is currently receiving a IVF bolus.  Also, bp is approximate to ER initial BP    Chinita Greenland MSNA MSN ACNPC-AG Acute Care Nurse Practitioner [facility]

## 2021-02-14 NOTE — Consult Note (Signed)
Subjective: 1. Sepsis, due to unspecified organism, unspecified whether acute organ dysfunction present ([place])   2. Acute cystitis without hematuria   3. Pyelonephritis      Consult requested by Dr. Irine Seal.  I was asked to see Ms. Gambale for right pyelonephritis with sepsis and a possible evolving RUP abscess.   She had the onset about a week ago of headache, myalgias and fever and noted that her urine was dark and malodorous.   She has had only one prior UTI and no other GU history.  She was seen in the [facility] yesterday and a CT demonstrated severe right pyelonephritis with a 2cm area in the RLP that is concerning for an evolving abscess.   Her urine was consistent with infection.  Cultures are pending.  She is on Cefipeme and Vancomycin and is feeling somewhat better but her fever has spiked again this morning.    ROS:  Review of Systems  Constitutional:  Positive for chills and fever.  Genitourinary:  Positive for flank pain.  Musculoskeletal:  Positive for myalgias.  Neurological:  Positive for headaches.  All other systems reviewed and are negative.  No Known Allergies  History reviewed. No pertinent past medical history.  History reviewed. No pertinent surgical history.  Social History   Socioeconomic History   Marital status: Single    Spouse name: Not on file   Number of children: Not on file   Years of education: Not on file   Highest education level: Not on file  Occupational History   Not on file  Tobacco Use   Smoking status: Every Day    Types: Cigarettes   Smokeless tobacco: Never  Vaping Use   Vaping Use: Every day  Substance and Sexual Activity   Alcohol use: Yes    Comment: socially   Drug use: Never   Sexual activity: Not on file  Other Topics Concern   Not on file  Social History Narrative   Not on file   Social Determinants of Health   Financial Resource Strain: Not on file  Food Insecurity: Not on file  Transportation Needs:  Not on file  Physical Activity: Not on file  Stress: Not on file  Social Connections: Not on file  Intimate Partner Violence: Not on file    Family History  Problem Relation Age of Onset   Arthritis Father     Anti-infectives: Anti-infectives (From admission, onward)    Start     Dose/Rate Route Frequency Ordered Stop   02/14/21 1000  cefTRIAXone (ROCEPHIN) 2 g in sodium chloride 0.9 % 100 mL IVPB  Status:  Discontinued        2 g 200 mL/hr over 30 Minutes Intravenous Every 24 hours 02/13/21 1827 02/13/21 2000   02/14/21 1000  vancomycin (VANCOREADY) IVPB 500 mg/100 mL        500 mg 100 mL/hr over 60 Minutes Intravenous Every 12 hours 02/13/21 2214     02/14/21 1000  ceFEPIme (MAXIPIME) 2 g in sodium chloride 0.9 % 100 mL IVPB        2 g 200 mL/hr over 30 Minutes Intravenous Every 8 hours 02/14/21 0844     02/13/21 2200  ceFEPIme (MAXIPIME) 2 g in sodium chloride 0.9 % 100 mL IVPB  Status:  Discontinued        2 g 200 mL/hr over 30 Minutes Intravenous Every 12 hours 02/13/21 2008 02/14/21 0844   02/13/21 2100  vancomycin (VANCOREADY) IVPB 1250 mg/250 mL  1,250 mg 166.7 mL/hr over 90 Minutes Intravenous  Once 02/13/21 2008 02/13/21 2333   02/13/21 1230  cefTRIAXone (ROCEPHIN) 1 g in sodium chloride 0.9 % 100 mL IVPB        1 g 200 mL/hr over 30 Minutes Intravenous  Once 02/13/21 1221 02/13/21 1315       Current Facility-Administered Medications  Medication Dose Route Frequency Provider Last Rate Last Admin   acetaminophen (TYLENOL) tablet 650 mg  650 mg Oral Q6H PRN Eugenie Filler, MD   650 mg at 02/14/21 [id number]   Or   acetaminophen (TYLENOL) suppository 650 mg  650 mg Rectal Q6H PRN Eugenie Filler, MD       ceFEPIme (MAXIPIME) 2 g in sodium chloride 0.9 % 100 mL IVPB  2 g Intravenous Q8H Green, Terri L, RPH       Chlorhexidine Gluconate Cloth 2 % PADS 6 each  6 each Topical Daily Eugenie Filler, MD   6 each at 02/13/21 2124   enoxaparin (LOVENOX) injection  40 mg  40 mg Subcutaneous Q24H Eugenie Filler, MD   40 mg at 02/13/21 2204   feeding supplement (ENSURE ENLIVE / ENSURE PLUS) liquid 237 mL  237 mL Oral BID BM Eugenie Filler, MD       ibuprofen (ADVIL) tablet 400 mg  400 mg Oral Q6H PRN Eugenie Filler, MD   400 mg at 02/14/21 [id number]   levonorgestrel-ethinyl estradiol (ALESSE) 0.1-20 MG-MCG per tablet 1 tablet  1 tablet Oral q morning Eugenie Filler, MD       magnesium sulfate IVPB 2 g 50 mL  2 g Intravenous Once Eugenie Filler, MD       melatonin tablet 3 mg  3 mg Oral QHS Kathryne Eriksson, NP   3 mg at 02/13/21 2255   metoprolol tartrate (LOPRESSOR) injection 5 mg  5 mg Intravenous Q6H PRN Eugenie Filler, MD       ondansetron [facility]) tablet 4 mg  4 mg Oral Q6H PRN Eugenie Filler, MD       Or   ondansetron [facility]) injection 4 mg  4 mg Intravenous Q6H PRN Eugenie Filler, MD       oxyCODONE (Oxy IR/ROXICODONE) immediate release tablet 5 mg  5 mg Oral Q4H PRN Eugenie Filler, MD       polyethylene glycol (MIRALAX / GLYCOLAX) packet 17 g  17 g Oral Daily PRN Eugenie Filler, MD       potassium chloride (KLOR-CON M) CR tablet 40 mEq  40 mEq Oral Once Eugenie Filler, MD       senna (SENOKOT) tablet 8.6 mg  1 tablet Oral BID Eugenie Filler, MD   8.6 mg at 02/13/21 2204   sodium bicarbonate 150 mEq in sterile water 1,150 mL infusion   Intravenous Continuous Eugenie Filler, MD 150 mL/hr at 02/14/21 0843 New Bag at 02/14/21 0843   sodium chloride flush (NS) 0.9 % injection 3 mL  3 mL Intravenous Q12H Eugenie Filler, MD       sodium phosphate (FLEET) 7-19 GM/118ML enema 1 enema  1 enema Rectal Once PRN Eugenie Filler, MD       sorbitol 70 % solution 30 mL  30 mL Oral Daily PRN Eugenie Filler, MD       vancomycin (VANCOREADY) IVPB 500 mg/100 mL  500 mg Intravenous Q12H Angela Adam, [facility]  Objective: Vital signs in last 24 hours: BP 113/75   Pulse (!) 123   Temp (!) 102 F (38.9  C) (Oral)   Resp (!) 26   Ht 5\' 2"  (1.575 m)   Wt 56.1 kg   LMP 01/30/2021   SpO2 92%   BMI 22.62 kg/m   Intake/Output from previous day: 11/28 0701 - 11/29 0700 In: 3117.3 [I.V.:2277.7; IV Piggyback:839.6] Out: 900 [Urine:900] Intake/Output this shift: Total I/O In: 227.3 [I.V.:208.4; IV Piggyback:18.9] Out: -    Physical Exam Vitals reviewed.  Constitutional:      Appearance: Normal appearance.  Cardiovascular:     Rate and Rhythm: Regular rhythm. Tachycardia present.  Pulmonary:     Effort: Pulmonary effort is normal. No respiratory distress.  Abdominal:     General: Abdomen is flat.     Palpations: Abdomen is soft.     Tenderness: There is left CVA tenderness.  Skin:    General: Skin is warm and dry.  Neurological:     General: No focal deficit present.     Mental Status: She is alert and oriented to person, place, and time.  Psychiatric:        Mood and Affect: Mood normal.        Behavior: Behavior normal.    Lab Results:  Results for orders placed or performed during the hospital encounter of 02/13/21 (from the past 24 hour(s))  Resp Panel by RT-PCR (Flu A&B, Covid) Nasopharyngeal Swab     Status: None   Collection Time: 02/13/21  9:58 AM   Specimen: Nasopharyngeal Swab; Nasopharyngeal(NP) swabs in vial transport medium  Result Value Ref Range   SARS Coronavirus 2 by RT PCR NEGATIVE NEGATIVE   Influenza A by PCR NEGATIVE NEGATIVE   Influenza B by PCR NEGATIVE NEGATIVE  CBC with Differential     Status: Abnormal   Collection Time: 02/13/21 12:00 PM  Result Value Ref Range   WBC 21.5 (H) 4.0 - 10.5 K/uL   RBC 4.18 3.87 - 5.11 MIL/uL   Hemoglobin 12.4 12.0 - 15.0 g/dL   HCT 36.7 36.0 - 46.0 %   MCV 87.8 80.0 - 100.0 fL   MCH 29.7 26.0 - 34.0 pg   MCHC 33.8 30.0 - 36.0 g/dL   RDW 12.6 11.5 - 15.5 %   Platelets 227 150 - 400 K/uL   nRBC 0.0 0.0 - 0.2 %   Neutrophils Relative % 84 %   Neutro Abs 18.2 (H) 1.7 - 7.7 K/uL   Lymphocytes Relative 4 %    Lymphs Abs 0.9 0.7 - 4.0 K/uL   Monocytes Relative 10 %   Monocytes Absolute 2.2 (H) 0.1 - 1.0 K/uL   Eosinophils Relative 0 %   Eosinophils Absolute 0.0 0.0 - 0.5 K/uL   Basophils Relative 1 %   Basophils Absolute 0.2 (H) 0.0 - 0.1 K/uL   Immature Granulocytes 1 %   Abs Immature Granulocytes 0.15 (H) 0.00 - 0.07 K/uL  Comprehensive metabolic panel     Status: Abnormal   Collection Time: 02/13/21 12:00 PM  Result Value Ref Range   Sodium 132 (L) 135 - 145 mmol/L   Potassium 4.0 3.5 - 5.1 mmol/L   Chloride 97 (L) 98 - 111 mmol/L   CO2 22 22 - 32 mmol/L   Glucose, Bld 89 70 - 99 mg/dL   BUN 16 6 - 20 mg/dL   Creatinine, Ser 1.28 (H) 0.44 - 1.00 mg/dL   Calcium 9.4 8.9 - 10.3 mg/dL  Total Protein 7.0 6.5 - 8.1 g/dL   Albumin 3.7 3.5 - 5.0 g/dL   AST 19 15 - 41 U/L   ALT 19 0 - 44 U/L   Alkaline Phosphatase 57 38 - 126 U/L   Total Bilirubin 0.5 0.3 - 1.2 mg/dL   GFR, Estimated >60 >60 mL/min   Anion gap 13 5 - 15  Lipase, blood     Status: Abnormal   Collection Time: 02/13/21 12:00 PM  Result Value Ref Range   Lipase <10 (L) 11 - 51 U/L  Urinalysis, Routine w reflex microscopic     Status: Abnormal   Collection Time: 02/13/21 12:03 PM  Result Value Ref Range   Color, Urine YELLOW YELLOW   APPearance CLOUDY (A) CLEAR   Specific Gravity, Urine 1.018 1.005 - 1.030   pH 5.5 5.0 - 8.0   Glucose, UA NEGATIVE NEGATIVE mg/dL   Hgb urine dipstick MODERATE (A) NEGATIVE   Bilirubin Urine NEGATIVE NEGATIVE   Ketones, ur NEGATIVE NEGATIVE mg/dL   Protein, ur 100 (A) NEGATIVE mg/dL   Nitrite NEGATIVE NEGATIVE   Leukocytes,Ua LARGE (A) NEGATIVE   RBC / HPF 21-50 0 - 5 RBC/hpf   WBC, UA >50 (H) 0 - 5 WBC/hpf   Bacteria, UA MANY (A) NONE SEEN   Squamous Epithelial / LPF 0-5 0 - 5   Mucus PRESENT    Hyaline Casts, UA PRESENT   Pregnancy, urine     Status: None   Collection Time: 02/13/21 12:03 PM  Result Value Ref Range   Preg Test, Ur NEGATIVE NEGATIVE  Lactic acid, plasma      Status: None   Collection Time: 02/13/21  2:01 PM  Result Value Ref Range   Lactic Acid, Venous 1.1 0.5 - 1.9 mmol/L  Blood Culture (routine x 2)     Status: None (Preliminary result)   Collection Time: 02/13/21  2:20 PM   Specimen: Right Antecubital; Blood  Result Value Ref Range   Specimen Description      RIGHT ANTECUBITAL Performed at [facility], [address], [place], [place] [address]    Special Requests      Blood Culture adequate volume Performed at [facility], [address] [place], [place] [address]    Culture      NO GROWTH < 12 HOURS Performed at [facility], [place] [address], [place], [place] [address]    Report Status PENDING   Lactic acid, plasma     Status: None   Collection Time: 02/13/21  3:42 PM  Result Value Ref Range   Lactic Acid, Venous 1.2 0.5 - 1.9 mmol/L  Blood Culture (routine x 2)     Status: None (Preliminary result)   Collection Time: 02/13/21  6:26 PM   Specimen: BLOOD  Result Value Ref Range   Specimen Description      BLOOD LEFT ANTECUBITAL Performed at [facility], [place] [address], [place], [place] [address]    Special Requests      BOTTLES DRAWN AEROBIC AND ANAEROBIC Blood Culture adequate volume Performed at [place] [address] [place], [place] [address]    Culture      NO GROWTH < 12 HOURS Performed at [place] [address], [place],  [address]    Report Status PENDING   HIV Antibody (routine testing w rflx)     Status: None   Collection Time: 02/13/21  7:16 PM  Result Value  Ref Range   HIV Screen 4th Generation wRfx Non Reactive Non Reactive  Procalcitonin     Status: None   Collection Time: 02/13/21  7:16 PM  Result Value Ref Range   Procalcitonin 11.31 ng/mL  MRSA Next Gen by PCR, Nasal     Status: None   Collection Time: 02/13/21  8:52 PM   Specimen: Nasal Mucosa; Nasal Swab  Result Value Ref Range    MRSA by PCR Next Gen NOT DETECTED NOT DETECTED  Magnesium     Status: None   Collection Time: 02/14/21  2:46 AM  Result Value Ref Range   Magnesium 1.8 1.7 - 2.4 mg/dL  Phosphorus     Status: None   Collection Time: 02/14/21  2:46 AM  Result Value Ref Range   Phosphorus 3.1 2.5 - 4.6 mg/dL  Comprehensive metabolic panel     Status: Abnormal   Collection Time: 02/14/21  2:46 AM  Result Value Ref Range   Sodium 136 135 - 145 mmol/L   Potassium 3.2 (L) 3.5 - 5.1 mmol/L   Chloride 110 98 - 111 mmol/L   CO2 17 (L) 22 - 32 mmol/L   Glucose, Bld 119 (H) 70 - 99 mg/dL   BUN 11 6 - 20 mg/dL   Creatinine, Ser 0.75 0.44 - 1.00 mg/dL   Calcium 7.8 (L) 8.9 - 10.3 mg/dL   Total Protein 5.0 (L) 6.5 - 8.1 g/dL   Albumin 2.1 (L) 3.5 - 5.0 g/dL   AST 18 15 - 41 U/L   ALT 18 0 - 44 U/L   Alkaline Phosphatase 56 38 - 126 U/L   Total Bilirubin 0.4 0.3 - 1.2 mg/dL   GFR, Estimated >60 >60 mL/min   Anion gap 9 5 - 15  CBC WITH DIFFERENTIAL     Status: Abnormal   Collection Time: 02/14/21  2:46 AM  Result Value Ref Range   WBC 15.2 (H) 4.0 - 10.5 K/uL   RBC 3.19 (L) 3.87 - 5.11 MIL/uL   Hemoglobin 9.8 (L) 12.0 - 15.0 g/dL   HCT 29.5 (L) 36.0 - 46.0 %   MCV 92.5 80.0 - 100.0 fL   MCH 30.7 26.0 - 34.0 pg   MCHC 33.2 30.0 - 36.0 g/dL   RDW 13.0 11.5 - 15.5 %   Platelets 185 150 - 400 K/uL   nRBC 0.0 0.0 - 0.2 %   Neutrophils Relative % 77 %   Neutro Abs 11.8 (H) 1.7 - 7.7 K/uL   Lymphocytes Relative 10 %   Lymphs Abs 1.5 0.7 - 4.0 K/uL   Monocytes Relative 11 %   Monocytes Absolute 1.6 (H) 0.1 - 1.0 K/uL   Eosinophils Relative 0 %   Eosinophils Absolute 0.0 0.0 - 0.5 K/uL   Basophils Relative 1 %   Basophils Absolute 0.1 0.0 - 0.1 K/uL   Immature Granulocytes 1 %   Abs Immature Granulocytes 0.14 (H) 0.00 - 0.07 K/uL  Protime-INR     Status: None   Collection Time: 02/14/21  2:46 AM  Result Value Ref Range   Prothrombin Time 15.2 11.4 - 15.2 seconds   INR 1.2 0.8 - 1.2  Glucose,  capillary     Status: Abnormal   Collection Time: 02/14/21  8:31 AM  Result Value Ref Range   Glucose-Capillary 102 (H) 70 - 99 mg/dL    BMET Recent Labs    02/13/21 1200 02/14/21 0246  NA 132* 136  K 4.0 3.2*  CL 97* 110  CO2 22 17*  GLUCOSE 89 119*  BUN 16 11  CREATININE 1.28* 0.75  CALCIUM 9.4 7.8*   PT/INR Recent Labs    02/14/21 0246  LABPROT 15.2  INR 1.2   ABG No results for input(s): PHART, HCO3 in the last 72 hours.  Invalid input(s): PCO2, PO2  Studies/Results: CT ABDOMEN PELVIS W CONTRAST  Result Date: 02/13/2021 CLINICAL DATA:  RIGHT lower quadrant pain. Flank pain, kidney stone suspected. RIGHT lower quadrant abdominal pain, appendicitis suspected. EXAM: CT ABDOMEN AND PELVIS WITH CONTRAST TECHNIQUE: Multidetector CT imaging of the abdomen and pelvis was performed using the standard protocol following bolus administration of intravenous contrast. CONTRAST:  4mL OMNIPAQUE IOHEXOL 300 MG/ML  SOLN COMPARISON:  None. FINDINGS: Lower chest: No acute abnormality. Hepatobiliary: No focal liver abnormality is seen. Gallbladder appears normal. No bile duct dilatation is seen. Pancreas: Unremarkable. No pancreatic ductal dilatation or surrounding inflammatory changes. Spleen: Normal in size without focal abnormality. Adrenals/Urinary Tract: Adrenal glands are unremarkable. Ill-defined edema throughout the RIGHT renal cortex. More confluent hypodensity within the anterior cortex of the lower pole, measuring approximately 2 cm greatest dimension, possible early developing abscess. Associated RIGHT perinephric fluid stranding/inflammation. LEFT kidney is unremarkable. No ureteral or bladder calculi are identified. Stomach/Bowel: No dilated large or small bowel loops are seen. No evidence of bowel wall inflammation. Majority of the appendix is obscured by surrounding bowel loops, but the small visualized portion of the appendix is unremarkable. Vascular/Lymphatic: No significant  vascular findings are present. No enlarged abdominal or pelvic lymph nodes. Reproductive: Uterus and bilateral adnexa are unremarkable. Other: No intraperitoneal fluid collection or abscess-like collection is seen. No free intraperitoneal air. Musculoskeletal: No acute or significant osseous findings. IMPRESSION: 1. Acute pyelonephritis of the RIGHT kidney with associated edema throughout the RIGHT renal cortex and prominent perinephric fluid stranding/inflammation. More confluent hypodensity within the lower pole of the RIGHT renal cortex, measuring approximately 2 cm greatest dimension, which could represent confluent edema or possible associated early developing renal abscess collection. Consider short-term follow-up CT to exclude developing abscess. 2. No renal or ureteral calculi. 3. No bowel obstruction or evidence of bowel wall inflammation. No evidence of appendicitis. No free intraperitoneal air. Electronically Signed   By: Bary Richard M.D.   On: 02/13/2021 13:51   DG Chest Portable 1 View  Result Date: 02/13/2021 CLINICAL DATA:  Cough EXAM: PORTABLE CHEST 1 VIEW COMPARISON:  None. FINDINGS: The heart size and mediastinal contours are within normal limits. Both lungs are clear. The visualized skeletal structures are unremarkable. IMPRESSION: No active disease. Electronically Signed   By: Allegra Lai M.D.   On: 02/13/2021 12:17    I have discussed her case with Dr. Janee Morn and reviewed the pertinent labs and imaging.   Assessment/Plan: Severe right pyelonephritis with sepsis and possible early abscess formation.   Continue broad spectrum antibiotics pending culture results.   Consider repeat CT in 2-3 days if her clinical response is not satisfactory.          No follow-ups on file.    CC: Dr. Ramiro Harvest.      Bjorn Pippin 02/14/2021 [phone]

## 2021-02-14 NOTE — Progress Notes (Signed)
Initial Nutrition Assessment  DOCUMENTATION CODES:   Not applicable  INTERVENTION:  - will d/c Ensure per patient preference. - will order 1 tablet multivitamin with minerals/day. - allow visitors to bring in foods and beverages that patient enjoys.   NUTRITION DIAGNOSIS:   Increased nutrient needs related to acute illness as evidenced by estimated needs.  GOAL:   Patient will meet greater than or equal to 90% of their needs  MONITOR:   PO intake, Labs, Weight trends, I & O's  REASON FOR ASSESSMENT:   Malnutrition Screening Tool  ASSESSMENT:   20 year old female no significant medical history. She presented to the ED with headache, myalgias, chills, fevers with temperatures as high as 103.7 on the day PTA, malodorous urine, R flank pain, some shortness of breath and mid-upper abdominal pain, and some lightheadedness.  In the ED she had CT abdomen/pelvis which was consistent with acute R-sided pyelonephritis. She was noted to be septic and hypotensive. She received 5 L of IV fluid in the ED.  Patient laying in bed with 2 female visitors at bedside.   She has not yet eaten today d/t having a fever this AM which led to nausea. She reports that symptoms began last Wednesday (11/23). Since that time, she was able to eat small portions 1-3 times/day (examples include 1/2 package of ramen or an egg). She was drinking water, propel, and orange juice and trying to drink as best as she could over the past 1 week.  When she had a fever she would feel nauseated and be unable to eat d/t this.   She does not weigh herself at home and was last weighed at an appointment on 11/25 at which time weight was 122 lb. She reports weighing 125-126 lb over the summer but is unsure of time frame for 3-4 lb weight loss.  A bottle of Ensure is on bedside table. Patient does not care for this supplement and is appreciative of RD offer to discontinue order for it.    Labs reviewed; CBGs: 102 mg/dl, K:  3.2 mmol/l, Ca: 7.8 mg/dl.  Medications reviewed; 2 g IV Mg sulfate x1 run 11/29, 3 mg melatonin/night, 40 mEq Klor-Con x2 doses 11/29, 1 tablet senokot BID  IVF; 150 mEq sodium bicarb-sterile water @ 75 ml/hr.     NUTRITION - FOCUSED PHYSICAL EXAM:  No muscle or fat depletions.   Diet Order:   Diet Order             Diet regular Room service appropriate? Yes; Fluid consistency: Thin  Diet effective now                   EDUCATION NEEDS:   Education needs have been addressed  Skin:  Skin Assessment: Reviewed RN Assessment  Last BM:  PTA/unknown  Height:   Ht Readings from Last 1 Encounters:  02/13/21 5\' 2"  (1.575 m) (18 %, Z= -0.90)*   * Growth percentiles are based on CDC (Girls, 2-20 Years) data.    Weight:   Wt Readings from Last 1 Encounters:  02/14/21 56.1 kg (42 %, Z= -0.19)*   * Growth percentiles are based on CDC (Girls, 2-20 Years) data.     Estimated Nutritional Needs:  Kcal:  1800-2000 kcal Protein:  90-105 grams Fluid:  >/= 2 L/day      02/16/21, MS, RD, LDN, CNSC Inpatient Clinical Dietitian RD pager # available in [facility]  After hours/weekend pager # available in [facility]

## 2021-02-14 NOTE — Progress Notes (Signed)
Provider Dr. Janee Morn changed decision to send to 5E to 2E due to red mews. Pt transferred to stepdown. Report given and pt transferred to room 1231.   02/13/21 1950  Assess: MEWS Score  Temp (!) 102.9 F (39.4 C)  BP (!) 101/46  Pulse Rate (!) 130  Resp 17  SpO2 99 %  O2 Device Room Air  Assess: MEWS Score  MEWS Temp 2  MEWS Systolic 0  MEWS Pulse 3  MEWS RR 0  MEWS LOC 0  MEWS Score 5  MEWS Score Color Red  Assess: if the MEWS score is Yellow or Red  Were vital signs taken at a resting state? Yes  Focused Assessment Change from prior assessment (see assessment flowsheet)  Does the patient meet 2 or more of the SIRS criteria? Yes  Does the patient have a confirmed or suspected source of infection? Yes  Provider and Rapid Response Notified? Yes  MEWS guidelines implemented *See Row Information* Yes  Treat  MEWS Interventions Administered prn meds/treatments (Motrin given per Dr. Docia Barrier, ice packs)  Take Vital Signs  Increase Vital Sign Frequency  Red: Q 1hr X 4 then Q 4hr X 4, if remains red, continue Q 4hrs  Escalate  MEWS: Escalate Red: discuss with charge nurse/RN and provider, consider discussing with RRT  Notify: Charge Nurse/RN  Name of Charge Nurse/RN Notified Adrienne RN  Date Charge Nurse/RN Notified 02/13/21  Time Charge Nurse/RN Notified 1955 (msg left with Diplomatic Services operational officer unable to reach at time)  Notify: Provider  Provider Name/Title Dr. Janee Morn  Date Provider Notified 02/13/21  Time Provider Notified 1953  Notification Type Face-to-face  Notification Reason Change in status  Provider response See new orders  Date of Provider Response 02/13/21  Time of Provider Response 0100  Notify: Rapid Response  Name of Rapid Response RN Notified  (Dr. Janee Morn informed them of need for bedchange from 5E to 2E)  Date Rapid Response Notified 02/14/21  Time Rapid Response Notified 1953  Document  Patient Outcome Transferred/level of care increased  Progress note  created (see row info) Yes  Assess: SIRS CRITERIA  SIRS Temperature  1  SIRS Pulse 1  SIRS Respirations  0  SIRS WBC 0  SIRS Score Sum  2

## 2021-02-14 NOTE — Progress Notes (Addendum)
    OVERNIGHT PROGRESS REPORT  Blood pressure maintains in the 90+/- SBP with MAP +/- 60 range.  Patients maintains mental status of A&O x 4 with interaction and is no obvious or stated distress.  Patient was mobile and had one unmeasured Urine out earlier tonight. Sodium is corrected WBC have reduced. Potassium and Magnesium supplementation ordered.  Temperature, HR. RR SPO2 are now normal. Headache has resolved. Patient is able to rest.  RN will bladder scan with completion of this bolus dose.   Update at 0624 Hrs 02/14/2021 Patient has mobilized to the bathroom and made 900cc of amber urine per assigned RN. BP at this time maintains >90 SBP and 65 MAP  No difficulty standing or motivating     Monica Goodman MSNA MSN ACNPC-AG Acute Care Nurse Practitioner [facility]

## 2021-02-14 NOTE — Progress Notes (Signed)
Pharmacy Antibiotic Note  Monica Goodman is a 20 y.o. female admitted on 02/13/2021 with  pyelonephritis .  Pharmacy has been consulted for vancomycin & cefepime dosing. UA +; CT A/P: Acute pyelonephritis of the RIGHT kidney  Plan: Vancomycin loading dose 1250 mg followed by vancomycin 750 mg IV q12 for est AUC 444 using SCr 0.75 Cefepime 2 gm IV q8 No renal stones noted on CT Rec change to ceftriaxone 2 gm IV q24  Height: 5\' 2"  (157.5 cm) Weight: 56.1 kg (123 lb 10.9 oz) IBW/kg (Calculated) : 50.1  Temp (24hrs), Avg:100.4 F (38 C), Min:98.1 F (36.7 C), Max:103.1 F (39.5 C)  Recent Labs  Lab 02/13/21 1200 02/13/21 1401 02/13/21 1542 02/14/21 0246  WBC 21.5*  --   --  15.2*  CREATININE 1.28*  --   --  0.75  LATICACIDVEN  --  1.1 1.2  --      Estimated Creatinine Clearance: 89.5 mL/min (by C-G formula based on SCr of 0.75 mg/dL).    No Known Allergies  Antimicrobials this admission: 11/28 CTX x 1 11/28 Vanc>> 11/28 Cefepime>> Dose adjustments this admission: 11/29 Cefepime 2gm q12 >> q8 11/29 Vanc 500mg  q12 >> 750mg  q12 with decrease in SCr  Microbiology results: 11/28 BCx2: ngtd 11/28 UCx: sent  Thank you for allowing pharmacy to be a part of this patient's care.  PharmD [facility] [phone] 02/14/2021, 8:59 AM

## 2021-02-14 NOTE — Progress Notes (Addendum)
PROGRESS NOTE    Monica Goodman  MHD:[id number] DOB: 2001-02-22 DOA: 02/13/2021 PCP: Timothy Lasso, MD   Chief Complaint  Patient presents with   Fever    Brief Narrative:  Patient a pleasant 20 year old female no significant past medical history presented to the ED with headache, myalgias, chills, fevers with temperatures as high as 103.7 on the day prior to admission, malodorous urine, right flank pain, some shortness of breath and mid upper abdominal pain, some lightheadedness.  Patient seen in the ED CT abdomen and pelvis done consistent with acute pyelonephritis of the right kidney with concern for early developing abscess formation, patient noted to be septic and hypotensive.  Patient admitted, pancultured, initially placed on IV Rocephin and IV antibiotics broadened to IV vancomycin and cefepime due to ongoing fevers.  Patient also noted to be hypotensive requiring about 5 to 6 L of normal saline boluses with blood pressures still remaining in the mid 90s.  Urology consulted and following.  PCCM consulted for further evaluation as patient with hypotension despite multiple fluid boluses.   Assessment & Plan:   Principal Problem:   Severe sepsis with septic shock (HCC) Active Problems:   Pyelonephritis   Sepsis (HCC)   Dehydration   Acute cystitis without hematuria   Hypokalemia   Anemia   Metabolic acidosis  #1 severe sepsis with septic shock secondary to acute right-sided pyelonephritis/probable early developing abscess, POA -Patient presented with fever(103.7 the morning of admission), chills, dark urine, right flank pain, noted to be tachycardic on admission, tachypneic, leukocytosis, urinalysis concerning for UTI, CT abdomen and pelvis consistent with right acute pyelonephritis with probable early developing abscess.  Patient noted on presentation to the ED to have a blood pressure of 91/54. -Patient pancultured with results pending.   -Patient initially placed on IV Rocephin  however due to ongoing fevers, chills, ongoing hypotension requiring about 5 to 6 L of IV fluid resuscitation with blood pressures remaining in the mid 90s IV antibiotic coverage was broadened to IV vancomycin and IV cefepime.   -Patient transferred to the stepdown unit.   -Urology consulted who reviewed films recommending continuation of IV antibiotics but if no improvement Rigg need repeat CT in 2 to 3 days.  -Due to ongoing hypotension despite 5 to 6 L fluid bolus with patient now with complaints of some shortness of breath and concern for volume overload, we will decrease IV fluids to 75 cc an hour.   -Leukocytosis trending down however patient with ongoing fevers. -IV albumin x1 dose.  -Wyse need to be placed on pressors to decrease volume overload.  -Consult with PCCM for further evaluation and management.  -Continue empiric IV vancomycin and IV cefepime.  -Supportive care, follow.    2.  Dehydration -Patient now with some complaints of shortness of breath, concern for volume overload due to aggressive fluid resuscitation and as such decrease IV fluids to 75 cc an hour.   #3.  Metabolic acidosis -Likely secondary to problem #1. -Change IV fluids to bicarb drip at 75 cc an hour.  4.  Anemia -Likely dilutional in nature. -Patient with no overt bleeding. -Check an anemia panel. -Follow H&H. -Transfusion threshold hemoglobin < 7.  5.  Hypokalemia -Magnesium at 1.8. -Magnesium sulfate 2 g IV x1 ordered this morning. -Replete potassium. -Repeat labs in the morning.  6.  Concern for volume overload -Patient with some complaints of shortness of breath, decreased breath sounds in the bases, positive JVD. -Patient received aggressive fluid resuscitation on admission and  overnight due to ongoing hypotension. -Decrease IV fluids to 75 cc an hour. -Give a dose of IV albumin x1. -If BP improves will give a dose of IV Lasix. -Follow.  Addendum 6:56 PM Was called by RN that patient with  some chest tightness and shortness of breath.  Chest x-ray ordered.  Lasix 20 mg IV x1 given.  Monitor urine output.  If clinical improvement with IV Lasix [place] consider another dose of Lasix in the a.m. if blood pressure able to tolerate.   DVT prophylaxis: Lovenox Code Status: Full Family Communication: Updated patient and sister at bedside. Disposition:   Status is: Inpatient  Remains inpatient appropriate because: Severity of illness       Consultants:  Urology: Dr.Wrenn 02/14/2021 PCCM pending  Procedures:  CT abdomen and pelvis 02/13/2021 Chest x-ray 02/13/2021   Antimicrobials: IV Rocephin 11/28/ 2022x1 dose IV vancomycin 02/13/2021>>>> IV cefepime 02/13/2021>>>>>   Subjective: Sitting up in bed.  Does not feel any better than she did on admission.  States that bedside.  Denies any chest pain.  Some complaints of shortness of breath and some shallow breaths.  Noted to have fevers all night.  Patient also noted to be hypotensive requiring several boluses of IV fluids approximately 5 to 6 L.  Objective: Vitals:   02/14/21 0645 02/14/21 0700 02/14/21 0733 02/14/21 0800  BP: 124/80 113/75    Pulse: (!) 130 (!) 123    Resp: (!) 28 (!) 26    Temp:   (!) 102 F (38.9 C) (!) 102.8 F (39.3 C)  TempSrc:   Oral Oral  SpO2: 96% 92%    Weight:      Height:        Intake/Output Summary (Last 24 hours) at 02/14/2021 1032 Last data filed at 02/14/2021 0844 Gross per 24 hour  Intake 3344.55 ml  Output 900 ml  Net 2444.55 ml   Filed Weights   02/13/21 1035 02/14/21 0500  Weight: 55.8 kg 56.1 kg    Examination:  General exam: Appears calm and comfortable  Respiratory system: Some bibasilar crackles.  Decreased breath sounds in the bases.  Some shallow breaths.  Speaking in full sentences.  Cardiovascular system: Tachycardia.  Positive JVD.  No murmurs rubs or gallops.  No lower extremity edema.  Gastrointestinal system: Abdomen is nondistended, soft and tender to  palpation in the right suprapubic and mid suprapubic region.  Positive right CVA tenderness to palpation.  Positive bowel sounds.  No rebound.  No guarding.  Central nervous system: Alert and oriented. No focal neurological deficits. Extremities: Symmetric 5 x 5 power. Skin: No rashes, lesions or ulcers Psychiatry: Judgement and insight appear normal. Mood & affect appropriate.     Data Reviewed: I have personally reviewed following labs and imaging studies  CBC: Recent Labs  Lab 02/13/21 1200 02/14/21 0246  WBC 21.5* 15.2*  NEUTROABS 18.2* 11.8*  HGB 12.4 9.8*  HCT 36.7 29.5*  MCV 87.8 92.5  PLT 227 [id number]    Basic Metabolic Panel: Recent Labs  Lab 02/13/21 1200 02/14/21 0246  NA 132* 136  K 4.0 3.2*  CL 97* 110  CO2 22 17*  GLUCOSE 89 119*  BUN 16 11  CREATININE 1.28* 0.75  CALCIUM 9.4 7.8*  MG  --  1.8  PHOS  --  3.1    GFR: Estimated Creatinine Clearance: 89.5 mL/min (by C-G formula based on SCr of 0.75 mg/dL).  Liver Function Tests: Recent Labs  Lab 02/13/21 1200 02/14/21 0246  AST  19 18  ALT 19 18  ALKPHOS 57 56  BILITOT 0.5 0.4  PROT 7.0 5.0*  ALBUMIN 3.7 2.1*    CBG: Recent Labs  Lab 02/14/21 0831  GLUCAP 102*     Recent Results (from the past 240 hour(s))  Resp Panel by RT-PCR (Flu A&B, Covid) Nasopharyngeal Swab     Status: None   Collection Time: 02/13/21  9:58 AM   Specimen: Nasopharyngeal Swab; Nasopharyngeal(NP) swabs in vial transport medium  Result Value Ref Range Status   SARS Coronavirus 2 by RT PCR NEGATIVE NEGATIVE Final    Comment: (NOTE) SARS-CoV-2 target nucleic acids are NOT DETECTED.  The SARS-CoV-2 RNA is generally detectable in upper respiratory specimens during the acute phase of infection. The lowest concentration of SARS-CoV-2 viral copies this assay can detect is 138 copies/mL. A negative result does not preclude SARS-Cov-2 infection and should not be used as the sole basis for treatment or other patient  management decisions. A negative result Mcmackin occur with  improper specimen collection/handling, submission of specimen other than nasopharyngeal swab, presence of viral mutation(s) within the areas targeted by this assay, and inadequate number of viral copies(<138 copies/mL). A negative result must be combined with clinical observations, patient history, and epidemiological information. The expected result is Negative.  Fact Sheet for Patients:  EntrepreneurPulse.com.au  Fact Sheet for Healthcare Providers:  IncredibleEmployment.be  This test is no t yet approved or cleared by the [place] FDA and  has been authorized for detection and/or diagnosis of SARS-CoV-2 by FDA under an Emergency Use Authorization (EUA). This EUA will remain  in effect (meaning this test can be used) for the duration of the COVID-19 declaration under Section 564(b)(1) of the Act, 21 U.S.C.section 360bbb-3(b)(1), unless the authorization is terminated  or revoked sooner.       Influenza A by PCR NEGATIVE NEGATIVE Final   Influenza B by PCR NEGATIVE NEGATIVE Final    Comment: (NOTE) The Xpert Xpress SARS-CoV-2/FLU/RSV plus assay is intended as an aid in the diagnosis of influenza from Nasopharyngeal swab specimens and should not be used as a sole basis for treatment. Nasal washings and aspirates are unacceptable for Xpert Xpress SARS-CoV-2/FLU/RSV testing.  Fact Sheet for Patients: EntrepreneurPulse.com.au  Fact Sheet for Healthcare Providers: IncredibleEmployment.be  This test is not yet approved or cleared by the [place] FDA and has been authorized for detection and/or diagnosis of SARS-CoV-2 by FDA under an Emergency Use Authorization (EUA). This EUA will remain in effect (meaning this test can be used) for the duration of the COVID-19 declaration under Section 564(b)(1) of the Act, 21 U.S.C. section 360bbb-3(b)(1),  unless the authorization is terminated or revoked.  Performed at [facility], [address], [place], [place] [address]   Blood Culture (routine x 2)     Status: None (Preliminary result)   Collection Time: 02/13/21  2:20 PM   Specimen: Right Antecubital; Blood  Result Value Ref Range Status   Specimen Description   Final    RIGHT ANTECUBITAL Performed at [facility], [address] [place], [place] [address]    Special Requests   Final    Blood Culture adequate volume Performed at [facility], [address] [place], [place] [address]    Culture   Final    NO GROWTH < 12 HOURS Performed at [facility], [place] [address], [place], [place] [address]    Report Status PENDING  Incomplete  Blood Culture (routine x 2)  Status: None (Preliminary result)   Collection Time: 02/13/21  6:26 PM   Specimen: BLOOD  Result Value Ref Range Status   Specimen Description   Final    BLOOD LEFT ANTECUBITAL Performed at [facility] [address], [place], [place] [address]    Special Requests   Final    BOTTLES DRAWN AEROBIC AND ANAEROBIC Blood Culture adequate volume Performed at [facility] [address], [place], [place] [address]    Culture   Final    NO GROWTH < 12 HOURS Performed at [facility] [address], [place], [place] [address]    Report Status PENDING  Incomplete  MRSA Next Gen by PCR, Nasal     Status: None   Collection Time: 02/13/21  8:52 PM   Specimen: Nasal Mucosa; Nasal Swab  Result Value Ref Range Status   MRSA by PCR Next Gen NOT DETECTED NOT DETECTED Final    Comment: (NOTE) The GeneXpert MRSA Assay (FDA approved for NASAL specimens only), is one component of a comprehensive MRSA colonization surveillance program. It is not intended to diagnose MRSA infection nor to guide or monitor treatment for MRSA infections. Test performance is not  FDA approved in patients less than 63 years old. Performed at [facility], [place] [address], [place],  [address]          Radiology Studies: CT ABDOMEN PELVIS W CONTRAST  Result Date: 02/13/2021 CLINICAL DATA:  RIGHT lower quadrant pain. Flank pain, kidney stone suspected. RIGHT lower quadrant abdominal pain, appendicitis suspected. EXAM: CT ABDOMEN AND PELVIS WITH CONTRAST TECHNIQUE: Multidetector CT imaging of the abdomen and pelvis was performed using the standard protocol following bolus administration of intravenous contrast. CONTRAST:  62mL OMNIPAQUE IOHEXOL 300 MG/ML  SOLN COMPARISON:  None. FINDINGS: Lower chest: No acute abnormality. Hepatobiliary: No focal liver abnormality is seen. Gallbladder appears normal. No bile duct dilatation is seen. Pancreas: Unremarkable. No pancreatic ductal dilatation or surrounding inflammatory changes. Spleen: Normal in size without focal abnormality. Adrenals/Urinary Tract: Adrenal glands are unremarkable. Ill-defined edema throughout the RIGHT renal cortex. More confluent hypodensity within the anterior cortex of the lower pole, measuring approximately 2 cm greatest dimension, possible early developing abscess. Associated RIGHT perinephric fluid stranding/inflammation. LEFT kidney is unremarkable. No ureteral or bladder calculi are identified. Stomach/Bowel: No dilated large or small bowel loops are seen. No evidence of bowel wall inflammation. Majority of the appendix is obscured by surrounding bowel loops, but the small visualized portion of the appendix is unremarkable. Vascular/Lymphatic: No significant vascular findings are present. No enlarged abdominal or pelvic lymph nodes. Reproductive: Uterus and bilateral adnexa are unremarkable. Other: No intraperitoneal fluid collection or abscess-like collection is seen. No free intraperitoneal air. Musculoskeletal: No acute or significant osseous findings. IMPRESSION: 1. Acute  pyelonephritis of the RIGHT kidney with associated edema throughout the RIGHT renal cortex and prominent perinephric fluid stranding/inflammation. More confluent hypodensity within the lower pole of the RIGHT renal cortex, measuring approximately 2 cm greatest dimension, which could represent confluent edema or possible associated early developing renal abscess collection. Consider short-term follow-up CT to exclude developing abscess. 2. No renal or ureteral calculi. 3. No bowel obstruction or evidence of bowel wall inflammation. No evidence of appendicitis. No free intraperitoneal air. Electronically Signed   By: Franki Cabot M.D.   On: 02/13/2021 13:51   DG Chest Portable 1 View  Result Date: 02/13/2021 CLINICAL DATA:  Cough EXAM: PORTABLE CHEST 1 VIEW COMPARISON:  None.  FINDINGS: The heart size and mediastinal contours are within normal limits. Both lungs are clear. The visualized skeletal structures are unremarkable. IMPRESSION: No active disease. Electronically Signed   By: Yetta Glassman M.D.   On: 02/13/2021 12:17        Scheduled Meds:  Chlorhexidine Gluconate Cloth  6 each Topical Daily   enoxaparin (LOVENOX) injection  40 mg Subcutaneous Q24H   feeding supplement  237 mL Oral BID BM   levonorgestrel-ethinyl estradiol  1 tablet Oral q morning   melatonin  3 mg Oral QHS   senna  1 tablet Oral BID   sodium chloride flush  3 mL Intravenous Q12H   Continuous Infusions:  albumin human     ceFEPime (MAXIPIME) IV 2 g (02/14/21 1011)    sodium bicarbonate (isotonic) infusion in sterile water 150 mL/hr at 02/14/21 0843   vancomycin       LOS: 1 day    Time spent: 45 minutes    Irine Seal, MD [facility]   To contact the attending provider between 7A-7P or the covering provider during after hours 7P-7A, please log into the web site [email or web address] and access using universal [place] password for that web site. If you do not have the password, please call the  hospital operator.  02/14/2021, 10:32 AM

## 2021-02-15 ENCOUNTER — Inpatient Hospital Stay (HOSPITAL_COMMUNITY): Payer: Medicaid Other

## 2021-02-15 DIAGNOSIS — N12 Tubulo-interstitial nephritis, not specified as acute or chronic: Secondary | ICD-10-CM | POA: Diagnosis not present

## 2021-02-15 DIAGNOSIS — E876 Hypokalemia: Secondary | ICD-10-CM | POA: Diagnosis not present

## 2021-02-15 DIAGNOSIS — J9601 Acute respiratory failure with hypoxia: Secondary | ICD-10-CM | POA: Diagnosis not present

## 2021-02-15 DIAGNOSIS — R6521 Severe sepsis with septic shock: Secondary | ICD-10-CM | POA: Diagnosis not present

## 2021-02-15 DIAGNOSIS — J90 Pleural effusion, not elsewhere classified: Secondary | ICD-10-CM | POA: Diagnosis not present

## 2021-02-15 DIAGNOSIS — E8721 Acute metabolic acidosis: Secondary | ICD-10-CM | POA: Diagnosis not present

## 2021-02-15 DIAGNOSIS — N3000 Acute cystitis without hematuria: Secondary | ICD-10-CM | POA: Diagnosis not present

## 2021-02-15 DIAGNOSIS — N179 Acute kidney failure, unspecified: Secondary | ICD-10-CM | POA: Diagnosis not present

## 2021-02-15 DIAGNOSIS — A419 Sepsis, unspecified organism: Secondary | ICD-10-CM | POA: Diagnosis not present

## 2021-02-15 DIAGNOSIS — E872 Acidosis, unspecified: Secondary | ICD-10-CM | POA: Diagnosis not present

## 2021-02-15 DIAGNOSIS — E860 Dehydration: Secondary | ICD-10-CM | POA: Diagnosis not present

## 2021-02-15 DIAGNOSIS — N151 Renal and perinephric abscess: Secondary | ICD-10-CM | POA: Diagnosis not present

## 2021-02-15 DIAGNOSIS — N10 Acute tubulo-interstitial nephritis: Secondary | ICD-10-CM | POA: Diagnosis not present

## 2021-02-15 DIAGNOSIS — R188 Other ascites: Secondary | ICD-10-CM | POA: Diagnosis not present

## 2021-02-15 DIAGNOSIS — D649 Anemia, unspecified: Secondary | ICD-10-CM | POA: Diagnosis not present

## 2021-02-15 DIAGNOSIS — Z20822 Contact with and (suspected) exposure to covid-19: Secondary | ICD-10-CM | POA: Diagnosis not present

## 2021-02-15 DIAGNOSIS — R0602 Shortness of breath: Secondary | ICD-10-CM | POA: Diagnosis not present

## 2021-02-15 LAB — CBC WITH DIFFERENTIAL/PLATELET
Abs Immature Granulocytes: 0.09 10*3/uL — ABNORMAL HIGH (ref 0.00–0.07)
Basophils Absolute: 0.1 10*3/uL (ref 0.0–0.1)
Basophils Relative: 1 %
Eosinophils Absolute: 0.1 10*3/uL (ref 0.0–0.5)
Eosinophils Relative: 1 %
HCT: 30.2 % — ABNORMAL LOW (ref 36.0–46.0)
Hemoglobin: 10.2 g/dL — ABNORMAL LOW (ref 12.0–15.0)
Immature Granulocytes: 1 %
Lymphocytes Relative: 8 %
Lymphs Abs: 1.1 10*3/uL (ref 0.7–4.0)
MCH: 29.7 pg (ref 26.0–34.0)
MCHC: 33.8 g/dL (ref 30.0–36.0)
MCV: 88 fL (ref 80.0–100.0)
Monocytes Absolute: 0.9 10*3/uL (ref 0.1–1.0)
Monocytes Relative: 7 %
Neutro Abs: 11 10*3/uL — ABNORMAL HIGH (ref 1.7–7.7)
Neutrophils Relative %: 82 %
Platelets: 226 10*3/uL (ref 150–400)
RBC: 3.43 MIL/uL — ABNORMAL LOW (ref 3.87–5.11)
RDW: 13.2 % (ref 11.5–15.5)
WBC: 13.3 10*3/uL — ABNORMAL HIGH (ref 4.0–10.5)
nRBC: 0 % (ref 0.0–0.2)

## 2021-02-15 LAB — COMPREHENSIVE METABOLIC PANEL
ALT: 20 U/L (ref 0–44)
AST: 19 U/L (ref 15–41)
Albumin: 2.6 g/dL — ABNORMAL LOW (ref 3.5–5.0)
Alkaline Phosphatase: 73 U/L (ref 38–126)
Anion gap: 11 (ref 5–15)
BUN: 8 mg/dL (ref 6–20)
CO2: 24 mmol/L (ref 22–32)
Calcium: 8 mg/dL — ABNORMAL LOW (ref 8.9–10.3)
Chloride: 100 mmol/L (ref 98–111)
Creatinine, Ser: 0.7 mg/dL (ref 0.44–1.00)
GFR, Estimated: >60 mL/min (ref >60)
Glucose, Bld: 100 mg/dL — ABNORMAL HIGH (ref 70–99)
Potassium: 2.7 mmol/L — CL (ref 3.5–5.1)
Sodium: 135 mmol/L (ref 135–145)
Total Bilirubin: 0.7 mg/dL (ref 0.3–1.2)
Total Protein: 5.8 g/dL — ABNORMAL LOW (ref 6.5–8.1)

## 2021-02-15 LAB — URINE CULTURE: Culture: >=100000 — AB

## 2021-02-15 LAB — PROCALCITONIN: Procalcitonin: 6.69 ng/mL

## 2021-02-15 LAB — D-DIMER, QUANTITATIVE: D-Dimer, Quant: 2.25 ug/mL-FEU — ABNORMAL HIGH (ref 0.00–0.50)

## 2021-02-15 LAB — LACTIC ACID, PLASMA: Lactic Acid, Venous: 0.8 mmol/L (ref 0.5–1.9)

## 2021-02-15 LAB — TROPONIN I (HIGH SENSITIVITY): Troponin I (High Sensitivity): 19 ng/L — ABNORMAL HIGH (ref <18)

## 2021-02-15 LAB — POTASSIUM: Potassium: 3.5 mmol/L (ref 3.5–5.1)

## 2021-02-15 LAB — MAGNESIUM: Magnesium: 2.1 mg/dL (ref 1.7–2.4)

## 2021-02-15 LAB — GLUCOSE, CAPILLARY: Glucose-Capillary: 73 mg/dL (ref 70–99)

## 2021-02-15 MED ORDER — IOHEXOL 350 MG/ML SOLN
80.0000 mL | Freq: Once | INTRAVENOUS | Status: AC | PRN
  Administered 2021-02-15: 80 mL via INTRAVENOUS

## 2021-02-15 MED ORDER — POTASSIUM CHLORIDE 10 MEQ/100ML IV SOLN
10.0000 meq | INTRAVENOUS | Status: AC
  Administered 2021-02-15 (×2): 10 meq via INTRAVENOUS
  Filled 2021-02-15 (×2): qty 100

## 2021-02-15 MED ORDER — SODIUM CHLORIDE 0.9 % IV SOLN
2.0000 g | INTRAVENOUS | Status: DC
  Administered 2021-02-15 – 2021-02-16 (×2): 2 g via INTRAVENOUS
  Filled 2021-02-15 (×3): qty 20

## 2021-02-15 MED ORDER — LIP MEDEX EX OINT
TOPICAL_OINTMENT | CUTANEOUS | Status: AC
  Filled 2021-02-15: qty 7

## 2021-02-15 MED ORDER — POTASSIUM CHLORIDE CRYS ER 20 MEQ PO TBCR
40.0000 meq | EXTENDED_RELEASE_TABLET | Freq: Two times a day (BID) | ORAL | Status: AC
  Administered 2021-02-15 (×2): 40 meq via ORAL
  Filled 2021-02-15 (×2): qty 2

## 2021-02-15 MED ORDER — FUROSEMIDE 10 MG/ML IJ SOLN
20.0000 mg | Freq: Once | INTRAMUSCULAR | Status: AC
  Administered 2021-02-15: 20 mg via INTRAVENOUS
  Filled 2021-02-15: qty 2

## 2021-02-15 NOTE — Progress Notes (Signed)
PROGRESS NOTE    Monica Goodman  [id number] DOB: February 02, 2001 DOA: 02/13/2021 PCP: Belva Chimes, MD   Chief Complaint  Patient presents with   Fever    Brief Narrative: 20 year old lady with no significant past medical history presents to ED with fever and chills, myalgias, malodorous urine, right flank pain, shortness of breath, abdominal pain and lightheadedness. CT of the abdomen and pelvis done on admission consistent with acute pyelonephritis of the right kidney with concern for developing abscess.  Patient was also found to be septic and hypotensive.  She was admitted and started on broad-spectrum IV antibiotics.  CCM consulted for hypotensive episodes and neurology on board for acute pyelonephritis.   Assessment & Plan:   Principal Problem:   Severe sepsis with septic shock (HCC) Active Problems:   Pyelonephritis   Dehydration   Acute cystitis without hematuria   Hypokalemia   Anemia   Metabolic acidosis   Severe sepsis with septic shock secondary to acute right-sided pyelonephritis/early developing abscess present on admission.  Patient continues to spike fevers despite improving leukocytosis and improving blood pressure parameters. Blood cultures have been ordered and results are pending at this time.  Urine cultures grew E. coli which is pansensitive.  Broad-spectrum IV antibiotics have been transitioned to IV Rocephin. Urology consulted recommended continuation of IV antibiotics and plan to repeat CT in the morning for evaluation of abscess.     Shortness of breath, some chest tightness Initially thought to be secondary to volume overload as patient received aggressive fluid resuscitation on admission and overnight due to hypotensive episodes. Will stop the fluids at this time and monitor. 1 dose of IV Lasix 20 mg given. Chest x-ray ordered. Patient is currently on 2 L of nasal cannula oxygen to keep sats greater than 90%. D-dimer ordered to evaluate patient is  on hormonal supplements. EKG ordered, showed sinus tachycardia without any ST or T wave changes. Troponin around 19     Hypokalemia and mild hypomagnesium Mia Replaced and repeat labs in the morning.    Mild anemia /normocytic Transfuse to keep hemoglobin greater than 7 Possibly dilutional in nature.     Acute metabolic acidosis Probably secondary to sepsis Bicarb has improved.   Mild AKI Probably secondary to dehydration and sepsis Creatinine is back to baseline at this time.    DVT prophylaxis: Lovenox Code Status: (Full code) Family Communication: family at bedside.  Disposition:   Status is: Inpatient  Remains inpatient appropriate because: IV antibiotics.      Consultants:  PCCM Urology.    Procedures: none.   Antimicrobials:  Antibiotics Given (last 72 hours)     Date/Time Action Medication Dose Rate   02/13/21 1237 New Bag/Given   cefTRIAXone (ROCEPHIN) 1 g in sodium chloride 0.9 % 100 mL IVPB 1 g 200 mL/hr   02/13/21 2034 New Bag/Given   ceFEPIme (MAXIPIME) 2 g in sodium chloride 0.9 % 100 mL IVPB 2 g 200 mL/hr   02/13/21 2203 New Bag/Given   vancomycin (VANCOREADY) IVPB 1250 mg/250 mL 1,250 mg 166.7 mL/hr   02/14/21 1011 New Bag/Given   ceFEPIme (MAXIPIME) 2 g in sodium chloride 0.9 % 100 mL IVPB 2 g 200 mL/hr   02/14/21 1106 New Bag/Given   vancomycin (VANCOREADY) IVPB 750 mg/150 mL 750 mg 150 mL/hr   02/14/21 1732 New Bag/Given   ceFEPIme (MAXIPIME) 2 g in sodium chloride 0.9 % 100 mL IVPB 2 g 200 mL/hr   02/14/21 2136 New Bag/Given   vancomycin (VANCOREADY) IVPB  750 mg/150 mL 750 mg 150 mL/hr   02/15/21 0231 New Bag/Given   ceFEPIme (MAXIPIME) 2 g in sodium chloride 0.9 % 100 mL IVPB 2 g 200 mL/hr   02/15/21 [id number] New Bag/Given   ceFEPIme (MAXIPIME) 2 g in sodium chloride 0.9 % 100 mL IVPB 2 g 200 mL/hr   02/15/21 1215 New Bag/Given   cefTRIAXone (ROCEPHIN) 2 g in sodium chloride 0.9 % 100 mL IVPB 2 g 200 mL/hr         Subjective: Pt reports some chest tightness, sob and some anxiety.   Objective: Vitals:   02/15/21 0800 02/15/21 0900 02/15/21 1100 02/15/21 1115  BP: 134/69 137/70 (!) 102/48 110/64  Pulse: (!) 112 86 77 77  Resp: (!) 32 (!) 26 (!) 0 16  Temp:    98.2 F (36.8 C)  TempSrc:    Oral  SpO2: 96% 95% 96% 95%  Weight:      Height:        Intake/Output Summary (Last 24 hours) at 02/15/2021 1245 Last data filed at 02/15/2021 1209 Gross per 24 hour  Intake 2246.87 ml  Output 3000 ml  Net -753.13 ml   Filed Weights   02/13/21 1035 02/14/21 0500  Weight: 55.8 kg 56.1 kg    Examination:  General exam: Appears calm and comfortable  Respiratory system: diminished air entry at bases, ion 2 lit of [place] oxygen.  Cardiovascular system: S1 & S2 heard, RRR. No JVD, murmurs, No pedal edema. Gastrointestinal system: Abdomen is nondistended, soft and nontender. Normal bowel sounds heard. Right flank tenderness present.  Central nervous system: Alert and oriented. No focal neurological deficits. Extremities: Symmetric 5 x 5 power. Skin: No rashes, lesions or ulcers Psychiatry: Mood & affect appropriate.     Data Reviewed: I have personally reviewed following labs and imaging studies  CBC: Recent Labs  Lab 02/13/21 1200 02/14/21 0246 02/15/21 0856  WBC 21.5* 15.2* 13.3*  NEUTROABS 18.2* 11.8* 11.0*  HGB 12.4 9.8* 10.2*  HCT 36.7 29.5* 30.2*  MCV 87.8 92.5 88.0  PLT 227 185 [id number]    Basic Metabolic Panel: Recent Labs  Lab 02/13/21 1200 02/14/21 0246 02/15/21 0257 02/15/21 0856  NA 132* 136  --  135  K 4.0 3.2*  --  2.7*  CL 97* 110  --  100  CO2 22 17*  --  24  GLUCOSE 89 119*  --  100*  BUN 16 11  --  8  CREATININE 1.28* 0.75  --  0.70  CALCIUM 9.4 7.8*  --  8.0*  MG  --  1.8 2.1  --   PHOS  --  3.1  --   --     GFR: Estimated Creatinine Clearance: 89.5 mL/min (by C-G formula based on SCr of 0.7 mg/dL).  Liver Function Tests: Recent Labs  Lab  02/13/21 1200 02/14/21 0246 02/15/21 0856  AST 19 18 19   ALT 19 18 20   ALKPHOS 57 56 73  BILITOT 0.5 0.4 0.7  PROT 7.0 5.0* 5.8*  ALBUMIN 3.7 2.1* 2.6*    CBG: Recent Labs  Lab 02/14/21 0831 02/15/21 0616  GLUCAP 102* 73     Recent Results (from the past 240 hour(s))  Resp Panel by RT-PCR (Flu A&B, Covid) Nasopharyngeal Swab     Status: None   Collection Time: 02/13/21  9:58 AM   Specimen: Nasopharyngeal Swab; Nasopharyngeal(NP) swabs in vial transport medium  Result Value Ref Range Status   SARS Coronavirus 2 by RT PCR NEGATIVE  NEGATIVE Final    Comment: (NOTE) SARS-CoV-2 target nucleic acids are NOT DETECTED.  The SARS-CoV-2 RNA is generally detectable in upper respiratory specimens during the acute phase of infection. The lowest concentration of SARS-CoV-2 viral copies this assay can detect is 138 copies/mL. A negative result does not preclude SARS-Cov-2 infection and should not be used as the sole basis for treatment or other patient management decisions. A negative result [place]z occur with  improper specimen collection/handling, submission of specimen other than nasopharyngeal swab, presence of viral mutation(s) within the areas targeted by this assay, and inadequate number of viral copies(<138 copies/mL). A negative result must be combined with clinical observations, patient history, and epidemiological information. The expected result is Negative.  Fact Sheet for Patients:  BloggerCourse.com  Fact Sheet for Healthcare Providers:  SeriousBroker.it  This test is no t yet approved or cleared by the [place] FDA and  has been authorized for detection and/or diagnosis of SARS-CoV-2 by FDA under an Emergency Use Authorization (EUA). This EUA will remain  in effect (meaning this test can be used) for the duration of the COVID-19 declaration under Section 564(b)(1) of the Act, 21 U.S.C.section 360bbb-3(b)(1),  unless the authorization is terminated  or revoked sooner.       Influenza A by PCR NEGATIVE NEGATIVE Final   Influenza B by PCR NEGATIVE NEGATIVE Final    Comment: (NOTE) The Xpert Xpress SARS-CoV-2/FLU/RSV plus assay is intended as an aid in the diagnosis of influenza from Nasopharyngeal swab specimens and should not be used as a sole basis for treatment. Nasal washings and aspirates are unacceptable for Xpert Xpress SARS-CoV-2/FLU/RSV testing.  Fact Sheet for Patients: BloggerCourse.com  Fact Sheet for Healthcare Providers: SeriousBroker.it  This test is not yet approved or cleared by the [place] FDA and has been authorized for detection and/or diagnosis of SARS-CoV-2 by FDA under an Emergency Use Authorization (EUA). This EUA will remain in effect (meaning this test can be used) for the duration of the COVID-19 declaration under Section 564(b)(1) of the Act, 21 U.S.C. section 360bbb-3(b)(1), unless the authorization is terminated or revoked.  Performed at [facility], [address], [place], [place] [address]   Urine Culture     Status: Abnormal   Collection Time: 02/13/21 12:36 PM   Specimen: Urine, Clean Catch  Result Value Ref Range Status   Specimen Description   Final    URINE, CLEAN CATCH Performed at [facility], [address], [place], [place] [address]    Special Requests   Final    NONE Performed at [facility], [address] [place], [place] [address]    Culture >=100,000 COLONIES/mL ESCHERICHIA COLI (A)  Final   Report Status 02/15/2021 FINAL  Final   Organism ID, Bacteria ESCHERICHIA COLI (A)  Final      Susceptibility   Escherichia coli - MIC*    AMPICILLIN 8 SENSITIVE Sensitive     CEFAZOLIN <=4 SENSITIVE Sensitive     CEFEPIME <=0.12 SENSITIVE Sensitive     CEFTRIAXONE <=0.25 SENSITIVE Sensitive     CIPROFLOXACIN <=0.25  SENSITIVE Sensitive     GENTAMICIN <=1 SENSITIVE Sensitive     IMIPENEM <=0.25 SENSITIVE Sensitive     NITROFURANTOIN <=16 SENSITIVE Sensitive     TRIMETH/SULFA <=20 SENSITIVE Sensitive     AMPICILLIN/SULBACTAM <=2 SENSITIVE Sensitive     PIP/TAZO <=4 SENSITIVE Sensitive     * >=100,000 COLONIES/mL ESCHERICHIA COLI  Blood Culture (routine x 2)  Status: None (Preliminary result)   Collection Time: 02/13/21  2:20 PM   Specimen: BLOOD  Result Value Ref Range Status   Specimen Description BLOOD RIGHT ANTECUBITAL  Final   Special Requests   Final    BOTTLES DRAWN AEROBIC AND ANAEROBIC Blood Culture adequate volume   Culture   Final    NO GROWTH 2 DAYS Performed at [facility], [address] [place], [place] [address]    Report Status PENDING  Incomplete  Blood Culture (routine x 2)     Status: None (Preliminary result)   Collection Time: 02/13/21  6:26 PM   Specimen: BLOOD  Result Value Ref Range Status   Specimen Description   Final    BLOOD LEFT ANTECUBITAL Performed at [facility] [address] [place], [place] [address]    Special Requests   Final    BOTTLES DRAWN AEROBIC AND ANAEROBIC Blood Culture adequate volume Performed at [facility] [address], [place], [place] [address]    Culture   Final    NO GROWTH 2 DAYS Performed at [facility] [address], [place], [place] [address]    Report Status PENDING  Incomplete  MRSA Next Gen by PCR, Nasal     Status: None   Collection Time: 02/13/21  8:52 PM   Specimen: Nasal Mucosa; Nasal Swab  Result Value Ref Range Status   MRSA by PCR Next Gen NOT DETECTED NOT DETECTED Final    Comment: (NOTE) The GeneXpert MRSA Assay (FDA approved for NASAL specimens only), is one component of a comprehensive MRSA colonization surveillance program. It is not intended to diagnose MRSA infection nor to guide or monitor treatment for MRSA infections. Test performance is not  FDA approved in patients less than 45 years old. Performed at [facility], [place] [address], [place], [place] [address]          Radiology Studies: CT ABDOMEN PELVIS W CONTRAST  Result Date: 02/13/2021 CLINICAL DATA:  RIGHT lower quadrant pain. Flank pain, kidney stone suspected. RIGHT lower quadrant abdominal pain, appendicitis suspected. EXAM: CT ABDOMEN AND PELVIS WITH CONTRAST TECHNIQUE: Multidetector CT imaging of the abdomen and pelvis was performed using the standard protocol following bolus administration of intravenous contrast. CONTRAST:  52mL OMNIPAQUE IOHEXOL 300 MG/ML  SOLN COMPARISON:  None. FINDINGS: Lower chest: No acute abnormality. Hepatobiliary: No focal liver abnormality is seen. Gallbladder appears normal. No bile duct dilatation is seen. Pancreas: Unremarkable. No pancreatic ductal dilatation or surrounding inflammatory changes. Spleen: Normal in size without focal abnormality. Adrenals/Urinary Tract: Adrenal glands are unremarkable. Ill-defined edema throughout the RIGHT renal cortex. More confluent hypodensity within the anterior cortex of the lower pole, measuring approximately 2 cm greatest dimension, possible early developing abscess. Associated RIGHT perinephric fluid stranding/inflammation. LEFT kidney is unremarkable. No ureteral or bladder calculi are identified. Stomach/Bowel: No dilated large or small bowel loops are seen. No evidence of bowel wall inflammation. Majority of the appendix is obscured by surrounding bowel loops, but the small visualized portion of the appendix is unremarkable. Vascular/Lymphatic: No significant vascular findings are present. No enlarged abdominal or pelvic lymph nodes. Reproductive: Uterus and bilateral adnexa are unremarkable. Other: No intraperitoneal fluid collection or abscess-like collection is seen. No free intraperitoneal air. Musculoskeletal: No acute or significant osseous findings. IMPRESSION: 1. Acute  pyelonephritis of the RIGHT kidney with associated edema throughout the RIGHT renal cortex and prominent perinephric fluid stranding/inflammation. More confluent hypodensity within the lower pole  of the RIGHT renal cortex, measuring approximately 2 cm greatest dimension, which could represent confluent edema or possible associated early developing renal abscess collection. Consider short-term follow-up CT to exclude developing abscess. 2. No renal or ureteral calculi. 3. No bowel obstruction or evidence of bowel wall inflammation. No evidence of appendicitis. No free intraperitoneal air. Electronically Signed   By: Franki Cabot M.D.   On: 02/13/2021 13:51   DG CHEST PORT 1 VIEW  Result Date: 02/14/2021 CLINICAL DATA:  Chest tightness with shortness of breath. EXAM: PORTABLE CHEST 1 VIEW COMPARISON:  Chest x-ray 02/13/2021. FINDINGS: There is a new small left pleural effusion. The right lung is clear. There is no pneumothorax. Cardiomediastinal silhouette is within normal limits. No acute fractures. IMPRESSION: 1. New small left pleural effusion. Electronically Signed   By: Ronney Asters M.D.   On: 02/14/2021 20:29        Scheduled Meds:  Chlorhexidine Gluconate Cloth  6 each Topical Daily   enoxaparin (LOVENOX) injection  40 mg Subcutaneous Q24H   levonorgestrel-ethinyl estradiol  1 tablet Oral q morning   melatonin  3 mg Oral QHS   multivitamin with minerals  1 tablet Oral Daily   potassium chloride  40 mEq Oral BID   senna  1 tablet Oral BID   sodium chloride flush  3 mL Intravenous Q12H   Continuous Infusions:  cefTRIAXone (ROCEPHIN)  IV 2 g (02/15/21 1215)     LOS: 2 days    Time spent: 42 minutes    Hosie Poisson, MD [facility]   To contact the attending provider between 7A-7P or the covering provider during after hours 7P-7A, please log into the web site [email or web address] and access using universal [place] password for that web site. If you do not have the password,  please call the hospital operator.  02/15/2021, 12:45 PM

## 2021-02-15 NOTE — Progress Notes (Signed)
Date and time results received: 02/15/21 0955 (use smartphrase ".now" to insert current time)  Test: potassium  Critical Value: 2.7  Name of Provider Notified: Dr. Blake Divine  Orders Received? Or Actions Taken?: Awaiting new orders from MD

## 2021-02-15 NOTE — Progress Notes (Signed)
Subjective: Monica Goodman reports continued headaches and myalgias but improved pain.   She has mild dyspnea with an O2 sat of 94% on [place] O2.  She continues to spike fevers to 103 but her WBC is falling.  The urine culture is growing e. Coli that is pan sensitive.  ROS:  Review of Systems  Constitutional:  Positive for chills and fever.  Respiratory:  Positive for shortness of breath.   Musculoskeletal:  Positive for myalgias.  Neurological:  Positive for headaches.   Anti-infectives: Anti-infectives (From admission, onward)    Start     Dose/Rate Route Frequency Ordered Stop   02/14/21 1000  cefTRIAXone (ROCEPHIN) 2 g in sodium chloride 0.9 % 100 mL IVPB  Status:  Discontinued        2 g 200 mL/hr over 30 Minutes Intravenous Every 24 hours 02/13/21 1827 02/13/21 2000   02/14/21 1000  vancomycin (VANCOREADY) IVPB 500 mg/100 mL  Status:  Discontinued        500 mg 100 mL/hr over 60 Minutes Intravenous Every 12 hours 02/13/21 2214 02/14/21 0857   02/14/21 1000  ceFEPIme (MAXIPIME) 2 g in sodium chloride 0.9 % 100 mL IVPB        2 g 200 mL/hr over 30 Minutes Intravenous Every 8 hours 02/14/21 0844     02/14/21 1000  vancomycin (VANCOREADY) IVPB 750 mg/150 mL        750 mg 150 mL/hr over 60 Minutes Intravenous Every 12 hours 02/14/21 0857     02/13/21 2200  ceFEPIme (MAXIPIME) 2 g in sodium chloride 0.9 % 100 mL IVPB  Status:  Discontinued        2 g 200 mL/hr over 30 Minutes Intravenous Every 12 hours 02/13/21 2008 02/14/21 0844   02/13/21 2100  vancomycin (VANCOREADY) IVPB 1250 mg/250 mL        1,250 mg 166.7 mL/hr over 90 Minutes Intravenous  Once 02/13/21 2008 02/13/21 2333   02/13/21 1230  cefTRIAXone (ROCEPHIN) 1 g in sodium chloride 0.9 % 100 mL IVPB        1 g 200 mL/hr over 30 Minutes Intravenous  Once 02/13/21 1221 02/13/21 1315       Current Facility-Administered Medications  Medication Dose Route Frequency Provider Last Rate Last Admin   acetaminophen (TYLENOL) tablet  650 mg  650 mg Oral Q6H PRN Rodolph Bong, MD   650 mg at 02/14/21 2241   Or   acetaminophen (TYLENOL) suppository 650 mg  650 mg Rectal Q6H PRN Rodolph Bong, MD       ceFEPIme (MAXIPIME) 2 g in sodium chloride 0.9 % 100 mL IVPB  2 g Intravenous Q8H Green, Terri L, RPH 200 mL/hr at 02/15/21 0752 2 g at 02/15/21 4098   Chlorhexidine Gluconate Cloth 2 % PADS 6 each  6 each Topical Daily Rodolph Bong, MD   6 each at 02/14/21 2139   enoxaparin (LOVENOX) injection 40 mg  40 mg Subcutaneous Q24H Rodolph Bong, MD   40 mg at 02/14/21 2133   furosemide (LASIX) 10 MG/ML injection            ibuprofen (ADVIL) tablet 400 mg  400 mg Oral Q6H PRN Rodolph Bong, MD   400 mg at 02/15/21 1191   levonorgestrel-ethinyl estradiol (ALESSE) 0.1-20 MG-MCG per tablet 1 tablet  1 tablet Oral q morning Rodolph Bong, MD       melatonin tablet 3 mg  3 mg Oral QHS Luiz Iron, NP  3 mg at 02/14/21 2133   metoprolol tartrate (LOPRESSOR) injection 5 mg  5 mg Intravenous Q6H PRN Rodolph Bong, MD       multivitamin with minerals tablet 1 tablet  1 tablet Oral Daily Rodolph Bong, MD   1 tablet at 02/14/21 1430   ondansetron (ZOFRAN) tablet 4 mg  4 mg Oral Q6H PRN Rodolph Bong, MD       Or   ondansetron [facility]) injection 4 mg  4 mg Intravenous Q6H PRN Rodolph Bong, MD   4 mg at 02/15/21 0814   oxyCODONE (Oxy IR/ROXICODONE) immediate release tablet 5 mg  5 mg Oral Q4H PRN Rodolph Bong, MD       polyethylene glycol (MIRALAX / GLYCOLAX) packet 17 g  17 g Oral Daily PRN Rodolph Bong, MD       senna (SENOKOT) tablet 8.6 mg  1 tablet Oral BID Rodolph Bong, MD   8.6 mg at 02/14/21 2133   sodium bicarbonate 150 mEq in sterile water 1,150 mL infusion   Intravenous Continuous Rodolph Bong, MD 75 mL/hr at 02/15/21 0311 Infusion Verify at 02/15/21 0311   sodium chloride flush (NS) 0.9 % injection 3 mL  3 mL Intravenous Q12H Rodolph Bong, MD        sodium phosphate (FLEET) 7-19 GM/118ML enema 1 enema  1 enema Rectal Once PRN Rodolph Bong, MD       sorbitol 70 % solution 30 mL  30 mL Oral Daily PRN Rodolph Bong, MD       vancomycin (VANCOREADY) IVPB 750 mg/150 mL  750 mg Intravenous Q12H Otho Bellows, RPH   Stopped at 02/14/21 2236     Objective: Vital signs in last 24 hours: Temp:  [98 F (36.7 C)-103.1 F (39.5 C)] 98.7 F (37.1 C) (11/30 0300) Pulse Rate:  [82-136] 111 (11/30 0732) Resp:  [15-33] 29 (11/30 0732) BP: (86-140)/(37-86) 140/67 (11/30 0732) SpO2:  [85 %-100 %] 90 % (11/30 0732)  Intake/Output from previous day: 11/29 0701 - 11/30 0700 In: 2195.9 [I.V.:1579; IV Piggyback:616.9] Out: 1800 [Urine:1800] Intake/Output this shift: No intake/output data recorded.   Physical Exam Vitals reviewed.  Constitutional:      Appearance: Normal appearance.  Cardiovascular:     Rate and Rhythm: Regular rhythm. Tachycardia present.  Pulmonary:     Effort: Pulmonary effort is normal. No respiratory distress.  Abdominal:     General: Abdomen is flat.     Palpations: Abdomen is soft.     Tenderness: There is right CVA tenderness (but it has improved).  Neurological:     Mental Status: She is alert.    Lab Results:  Recent Labs    02/13/21 1200 02/14/21 0246  WBC 21.5* 15.2*  HGB 12.4 9.8*  HCT 36.7 29.5*  PLT 227 185   BMET Recent Labs    02/13/21 1200 02/14/21 0246  NA 132* 136  K 4.0 3.2*  CL 97* 110  CO2 22 17*  GLUCOSE 89 119*  BUN 16 11  CREATININE 1.28* 0.75  CALCIUM 9.4 7.8*   PT/INR Recent Labs    02/14/21 0246  LABPROT 15.2  INR 1.2   ABG No results for input(s): PHART, HCO3 in the last 72 hours.  Invalid input(s): PCO2, PO2  Studies/Results: CT ABDOMEN PELVIS W CONTRAST  Result Date: 02/13/2021 CLINICAL DATA:  RIGHT lower quadrant pain. Flank pain, kidney stone suspected. RIGHT lower quadrant abdominal pain, appendicitis suspected. EXAM: CT  ABDOMEN AND PELVIS WITH  CONTRAST TECHNIQUE: Multidetector CT imaging of the abdomen and pelvis was performed using the standard protocol following bolus administration of intravenous contrast. CONTRAST:  68mL OMNIPAQUE IOHEXOL 300 MG/ML  SOLN COMPARISON:  None. FINDINGS: Lower chest: No acute abnormality. Hepatobiliary: No focal liver abnormality is seen. Gallbladder appears normal. No bile duct dilatation is seen. Pancreas: Unremarkable. No pancreatic ductal dilatation or surrounding inflammatory changes. Spleen: Normal in size without focal abnormality. Adrenals/Urinary Tract: Adrenal glands are unremarkable. Ill-defined edema throughout the RIGHT renal cortex. More confluent hypodensity within the anterior cortex of the lower pole, measuring approximately 2 cm greatest dimension, possible early developing abscess. Associated RIGHT perinephric fluid stranding/inflammation. LEFT kidney is unremarkable. No ureteral or bladder calculi are identified. Stomach/Bowel: No dilated large or small bowel loops are seen. No evidence of bowel wall inflammation. Majority of the appendix is obscured by surrounding bowel loops, but the small visualized portion of the appendix is unremarkable. Vascular/Lymphatic: No significant vascular findings are present. No enlarged abdominal or pelvic lymph nodes. Reproductive: Uterus and bilateral adnexa are unremarkable. Other: No intraperitoneal fluid collection or abscess-like collection is seen. No free intraperitoneal air. Musculoskeletal: No acute or significant osseous findings. IMPRESSION: 1. Acute pyelonephritis of the RIGHT kidney with associated edema throughout the RIGHT renal cortex and prominent perinephric fluid stranding/inflammation. More confluent hypodensity within the lower pole of the RIGHT renal cortex, measuring approximately 2 cm greatest dimension, which could represent confluent edema or possible associated early developing renal abscess collection. Consider short-term follow-up CT to  exclude developing abscess. 2. No renal or ureteral calculi. 3. No bowel obstruction or evidence of bowel wall inflammation. No evidence of appendicitis. No free intraperitoneal air. Electronically Signed   By: Bary Richard M.D.   On: 02/13/2021 13:51   DG CHEST PORT 1 VIEW  Result Date: 02/14/2021 CLINICAL DATA:  Chest tightness with shortness of breath. EXAM: PORTABLE CHEST 1 VIEW COMPARISON:  Chest x-ray 02/13/2021. FINDINGS: There is a new small left pleural effusion. The right lung is clear. There is no pneumothorax. Cardiomediastinal silhouette is within normal limits. No acute fractures. IMPRESSION: 1. New small left pleural effusion. Electronically Signed   By: Darliss Cheney M.D.   On: 02/14/2021 20:29   DG Chest Portable 1 View  Result Date: 02/13/2021 CLINICAL DATA:  Cough EXAM: PORTABLE CHEST 1 VIEW COMPARISON:  None. FINDINGS: The heart size and mediastinal contours are within normal limits. Both lungs are clear. The visualized skeletal structures are unremarkable. IMPRESSION: No active disease. Electronically Signed   By: Allegra Lai M.D.   On: 02/13/2021 12:17     Assessment and Plan: Right pyelonephritis with possible evolving RLP abscess and sepsis.   Her pain is better and her WBC is down but she continues to spike fevers.   I would recommended repeating the CT tomorrow, particularly if there is not dramatic improvement.         LOS: 2 days    Bjorn Pippin 02/15/2021 [phone] Patient ID: Monica Goodman, female   DOB: [birth year], 20 y.o.   MRN: [id number]

## 2021-02-16 ENCOUNTER — Inpatient Hospital Stay (HOSPITAL_COMMUNITY): Payer: Medicaid Other

## 2021-02-16 ENCOUNTER — Encounter (HOSPITAL_COMMUNITY): Payer: Self-pay | Admitting: Internal Medicine

## 2021-02-16 DIAGNOSIS — N3000 Acute cystitis without hematuria: Secondary | ICD-10-CM | POA: Diagnosis not present

## 2021-02-16 DIAGNOSIS — R6521 Severe sepsis with septic shock: Secondary | ICD-10-CM | POA: Diagnosis not present

## 2021-02-16 DIAGNOSIS — E860 Dehydration: Secondary | ICD-10-CM | POA: Diagnosis not present

## 2021-02-16 DIAGNOSIS — E876 Hypokalemia: Secondary | ICD-10-CM | POA: Diagnosis not present

## 2021-02-16 DIAGNOSIS — N12 Tubulo-interstitial nephritis, not specified as acute or chronic: Secondary | ICD-10-CM | POA: Diagnosis not present

## 2021-02-16 DIAGNOSIS — A419 Sepsis, unspecified organism: Secondary | ICD-10-CM | POA: Diagnosis not present

## 2021-02-16 DIAGNOSIS — E872 Acidosis, unspecified: Secondary | ICD-10-CM | POA: Diagnosis not present

## 2021-02-16 LAB — GLUCOSE, CAPILLARY: Glucose-Capillary: 79 mg/dL (ref 70–99)

## 2021-02-16 LAB — BASIC METABOLIC PANEL
Anion gap: 9 (ref 5–15)
BUN: 7 mg/dL (ref 6–20)
CO2: 23 mmol/L (ref 22–32)
Calcium: 8.6 mg/dL — ABNORMAL LOW (ref 8.9–10.3)
Chloride: 104 mmol/L (ref 98–111)
Creatinine, Ser: 0.72 mg/dL (ref 0.44–1.00)
GFR, Estimated: >60 mL/min (ref >60)
Glucose, Bld: 91 mg/dL (ref 70–99)
Potassium: 4.2 mmol/L (ref 3.5–5.1)
Sodium: 136 mmol/L (ref 135–145)

## 2021-02-16 LAB — CBC WITH DIFFERENTIAL/PLATELET
Abs Immature Granulocytes: 0.13 10*3/uL — ABNORMAL HIGH (ref 0.00–0.07)
Basophils Absolute: 0.1 10*3/uL (ref 0.0–0.1)
Basophils Relative: 1 %
Eosinophils Absolute: 0.2 10*3/uL (ref 0.0–0.5)
Eosinophils Relative: 1 %
HCT: 32 % — ABNORMAL LOW (ref 36.0–46.0)
Hemoglobin: 10.7 g/dL — ABNORMAL LOW (ref 12.0–15.0)
Immature Granulocytes: 1 %
Lymphocytes Relative: 19 %
Lymphs Abs: 2 10*3/uL (ref 0.7–4.0)
MCH: 29.8 pg (ref 26.0–34.0)
MCHC: 33.4 g/dL (ref 30.0–36.0)
MCV: 89.1 fL (ref 80.0–100.0)
Monocytes Absolute: 0.9 10*3/uL (ref 0.1–1.0)
Monocytes Relative: 9 %
Neutro Abs: 7.2 10*3/uL (ref 1.7–7.7)
Neutrophils Relative %: 69 %
Platelets: 262 10*3/uL (ref 150–400)
RBC: 3.59 MIL/uL — ABNORMAL LOW (ref 3.87–5.11)
RDW: 13.3 % (ref 11.5–15.5)
WBC: 10.5 10*3/uL (ref 4.0–10.5)
nRBC: 0 % (ref 0.0–0.2)

## 2021-02-16 LAB — PROCALCITONIN: Procalcitonin: 4.44 ng/mL

## 2021-02-16 NOTE — Progress Notes (Signed)
PROGRESS NOTE    Monica Goodman  [id number] DOB: 15-Sep-2000 DOA: 02/13/2021 PCP: Belva Chimes, MD   Chief Complaint  Patient presents with   Fever    Brief Narrative: 20 year old lady with no significant past medical history presents to ED with fever and chills, myalgias, malodorous urine, right flank pain, shortness of breath, abdominal pain and lightheadedness. CT of the abdomen and pelvis done on admission consistent with acute pyelonephritis of the right kidney with concern for developing abscess.  Patient was also found to be septic and hypotensive.  She was admitted and started on broad-spectrum IV antibiotics.  CCM consulted for hypotensive episodes and neurology on board for acute pyelonephritis. Patient seen and examined this morning, fever curve has improved. T-max is 99.8.  She has been weaned off oxygen at this time.   Assessment & Plan:   Principal Problem:   Severe sepsis with septic shock (HCC) Active Problems:   Pyelonephritis   Dehydration   Acute cystitis without hematuria   Hypokalemia   Anemia   Metabolic acidosis   Severe sepsis with septic shock secondary to acute right-sided pyelonephritis/early developing abscess present on admission.  Fevers have resolved, leukocytosis has improved, blood pressure parameters have improved Blood cultures have been ordered and results are pending at this time.  Urine cultures grew E. coli which is pansensitive.  Broad-spectrum IV antibiotics have been transitioned to IV Rocephin. Urology consulted recommended continuation of IV antibiotics  Repeat CT of the abdomen and pelvis ordered and results are pending at this time Improving procalcitonin, lactic acid has normalized    Shortness of breath, some chest tightness/acute respiratory failure with hypoxia probably secondary to mild fluid overload Initially thought to be secondary to volume overload as patient received aggressive fluid resuscitation on admission and  overnight due to hypotensive episodes. Will stop the fluids at this time and monitor.  Symptoms have improved after IV Lasix.   CT angiogram of the chest ordered to evaluate for PE was negative.  Patient was found to have bilateral  Lobe consolidation probably edema with small bilateral effusions.    Hypokalemia and mild hypomagnesium  Replaced and repeat levels within normal   Mild anemia /normocytic Transfuse to keep hemoglobin greater than 7 Possibly dilutional in nature. Hemoglobin stable around 10     Acute metabolic acidosis Probably secondary to sepsis Bicarb has improved.   Mild AKI Probably secondary to dehydration and sepsis Creatinine is back to baseline at this time.    DVT prophylaxis: Lovenox Code Status: (Full code) Family Communication: family at bedside.  Disposition:   Status is: Inpatient  Remains inpatient appropriate because: IV antibiotics.      Consultants:  PCCM Urology.    Procedures: none.   Antimicrobials:  Antibiotics Given (last 72 hours)     Date/Time Action Medication Dose Rate   02/13/21 2034 New Bag/Given   ceFEPIme (MAXIPIME) 2 g in sodium chloride 0.9 % 100 mL IVPB 2 g 200 mL/hr   02/13/21 2203 New Bag/Given   vancomycin (VANCOREADY) IVPB 1250 mg/250 mL 1,250 mg 166.7 mL/hr   02/14/21 1011 New Bag/Given   ceFEPIme (MAXIPIME) 2 g in sodium chloride 0.9 % 100 mL IVPB 2 g 200 mL/hr   02/14/21 1106 New Bag/Given   vancomycin (VANCOREADY) IVPB 750 mg/150 mL 750 mg 150 mL/hr   02/14/21 1732 New Bag/Given   ceFEPIme (MAXIPIME) 2 g in sodium chloride 0.9 % 100 mL IVPB 2 g 200 mL/hr   02/14/21 2136 New Bag/Given  vancomycin (VANCOREADY) IVPB 750 mg/150 mL 750 mg 150 mL/hr   02/15/21 0231 New Bag/Given   ceFEPIme (MAXIPIME) 2 g in sodium chloride 0.9 % 100 mL IVPB 2 g 200 mL/hr   02/15/21 0752 New Bag/Given   ceFEPIme (MAXIPIME) 2 g in sodium chloride 0.9 % 100 mL IVPB 2 g 200 mL/hr   02/15/21 1215 New Bag/Given    cefTRIAXone (ROCEPHIN) 2 g in sodium chloride 0.9 % 100 mL IVPB 2 g 200 mL/hr   02/16/21 1142 New Bag/Given   cefTRIAXone (ROCEPHIN) 2 g in sodium chloride 0.9 % 100 mL IVPB 2 g 200 mL/hr        Subjective: patient reports her chest tightness has resolved no shortness of breath, she is weaned off oxygen at this time  Objective: Vitals:   02/16/21 1100 02/16/21 1124 02/16/21 1200 02/16/21 1205  BP: 110/70  (!) 89/48 (!) 109/55  Pulse: 61  (!) 58 66  Resp: 16  18 20   Temp:  98.2 F (36.8 C)    TempSrc:      SpO2: 93%  95% 94%  Weight:      Height:        Intake/Output Summary (Last 24 hours) at 02/16/2021 1329 Last data filed at 02/16/2021 1209 Gross per 24 hour  Intake 239.96 ml  Output 725 ml  Net -485.04 ml    Filed Weights   02/13/21 1035 02/14/21 0500 02/16/21 0427  Weight: 55.8 kg 56.1 kg 56.3 kg    Examination:  General exam: Appears calm and comfortable  Respiratory system: Clear to auscultation. Respiratory effort normal. Cardiovascular system: S1 & S2 heard, RRR. No JVD,  No pedal edema. Gastrointestinal system: Abdomen is nondistended, soft and nontender. Normal bowel sounds heard. Central nervous system: Alert and oriented. No focal neurological deficits. Extremities: Symmetric 5 x 5 power. Skin: No rashes, lesions or ulcers Psychiatry: Mood & affect appropriate.      Data Reviewed: I have personally reviewed following labs and imaging studies  CBC: Recent Labs  Lab 02/13/21 1200 02/14/21 0246 02/15/21 0856 02/16/21 0245  WBC 21.5* 15.2* 13.3* 10.5  NEUTROABS 18.2* 11.8* 11.0* 7.2  HGB 12.4 9.8* 10.2* 10.7*  HCT 36.7 29.5* 30.2* 32.0*  MCV 87.8 92.5 88.0 89.1  PLT 227 185 226 262     Basic Metabolic Panel: Recent Labs  Lab 02/13/21 1200 02/14/21 0246 02/15/21 0257 02/15/21 0856 02/15/21 1824 02/16/21 0245  NA 132* 136  --  135  --  136  K 4.0 3.2*  --  2.7* 3.5 4.2  CL 97* 110  --  100  --  104  CO2 22 17*  --  24  --  23   GLUCOSE 89 119*  --  100*  --  91  BUN 16 11  --  8  --  7  CREATININE 1.28* 0.75  --  0.70  --  0.72  CALCIUM 9.4 7.8*  --  8.0*  --  8.6*  MG  --  1.8 2.1  --   --   --   PHOS  --  3.1  --   --   --   --      GFR: Estimated Creatinine Clearance: 89.5 mL/min (by C-G formula based on SCr of 0.72 mg/dL).  Liver Function Tests: Recent Labs  Lab 02/13/21 1200 02/14/21 0246 02/15/21 0856  AST 19 18 19   ALT 19 18 20   ALKPHOS 57 56 73  BILITOT 0.5 0.4 0.7  PROT  7.0 5.0* 5.8*  ALBUMIN 3.7 2.1* 2.6*     CBG: Recent Labs  Lab 02/14/21 0831 02/15/21 0616 02/16/21 0833  GLUCAP 102* 73 79      Recent Results (from the past 240 hour(s))  Resp Panel by RT-PCR (Flu A&B, Covid) Nasopharyngeal Swab     Status: None   Collection Time: 02/13/21  9:58 AM   Specimen: Nasopharyngeal Swab; Nasopharyngeal(NP) swabs in vial transport medium  Result Value Ref Range Status   SARS Coronavirus 2 by RT PCR NEGATIVE NEGATIVE Final    Comment: (NOTE) SARS-CoV-2 target nucleic acids are NOT DETECTED.  The SARS-CoV-2 RNA is generally detectable in upper respiratory specimens during the acute phase of infection. The lowest concentration of SARS-CoV-2 viral copies this assay can detect is 138 copies/mL. A negative result does not preclude SARS-Cov-2 infection and should not be used as the sole basis for treatment or other patient management decisions. A negative result Rasor occur with  improper specimen collection/handling, submission of specimen other than nasopharyngeal swab, presence of viral mutation(s) within the areas targeted by this assay, and inadequate number of viral copies(<138 copies/mL). A negative result must be combined with clinical observations, patient history, and epidemiological information. The expected result is Negative.  Fact Sheet for Patients:  EntrepreneurPulse.com.au  Fact Sheet for Healthcare Providers:   IncredibleEmployment.be  This test is no t yet approved or cleared by the [place] FDA and  has been authorized for detection and/or diagnosis of SARS-CoV-2 by FDA under an Emergency Use Authorization (EUA). This EUA will remain  in effect (meaning this test can be used) for the duration of the COVID-19 declaration under Section 564(b)(1) of the Act, 21 U.S.C.section 360bbb-3(b)(1), unless the authorization is terminated  or revoked sooner.       Influenza A by PCR NEGATIVE NEGATIVE Final   Influenza B by PCR NEGATIVE NEGATIVE Final    Comment: (NOTE) The Xpert Xpress SARS-CoV-2/FLU/RSV plus assay is intended as an aid in the diagnosis of influenza from Nasopharyngeal swab specimens and should not be used as a sole basis for treatment. Nasal washings and aspirates are unacceptable for Xpert Xpress SARS-CoV-2/FLU/RSV testing.  Fact Sheet for Patients: EntrepreneurPulse.com.au  Fact Sheet for Healthcare Providers: IncredibleEmployment.be  This test is not yet approved or cleared by the [place] FDA and has been authorized for detection and/or diagnosis of SARS-CoV-2 by FDA under an Emergency Use Authorization (EUA). This EUA will remain in effect (meaning this test can be used) for the duration of the COVID-19 declaration under Section 564(b)(1) of the Act, 21 U.S.C. section 360bbb-3(b)(1), unless the authorization is terminated or revoked.  Performed at [facility], [address] [place], [place] [address]   Urine Culture     Status: Abnormal   Collection Time: 02/13/21 12:36 PM   Specimen: Urine, Clean Catch  Result Value Ref Range Status   Specimen Description   Final    URINE, CLEAN CATCH Performed at [facility], [address] [place], [place] [address]    Special Requests   Final    NONE Performed at [facility], [address],  [place], [place] [address]    Culture >=100,000 COLONIES/mL ESCHERICHIA COLI (A)  Final   Report Status 02/15/2021 FINAL  Final   Organism ID, Bacteria ESCHERICHIA COLI (A)  Final      Susceptibility   Escherichia coli - MIC*    AMPICILLIN 8 SENSITIVE Sensitive     CEFAZOLIN <=4 SENSITIVE Sensitive  CEFEPIME <=0.12 SENSITIVE Sensitive     CEFTRIAXONE <=0.25 SENSITIVE Sensitive     CIPROFLOXACIN <=0.25 SENSITIVE Sensitive     GENTAMICIN <=1 SENSITIVE Sensitive     IMIPENEM <=0.25 SENSITIVE Sensitive     NITROFURANTOIN <=16 SENSITIVE Sensitive     TRIMETH/SULFA <=20 SENSITIVE Sensitive     AMPICILLIN/SULBACTAM <=2 SENSITIVE Sensitive     PIP/TAZO <=4 SENSITIVE Sensitive     * >=100,000 COLONIES/mL ESCHERICHIA COLI  Blood Culture (routine x 2)     Status: None (Preliminary result)   Collection Time: 02/13/21  2:20 PM   Specimen: BLOOD  Result Value Ref Range Status   Specimen Description BLOOD RIGHT ANTECUBITAL  Final   Special Requests   Final    BOTTLES DRAWN AEROBIC AND ANAEROBIC Blood Culture adequate volume   Culture   Final    NO GROWTH 3 DAYS Performed at [facility], [address] [place], [place] [address]    Report Status PENDING  Incomplete  Blood Culture (routine x 2)     Status: None (Preliminary result)   Collection Time: 02/13/21  6:26 PM   Specimen: BLOOD  Result Value Ref Range Status   Specimen Description   Final    BLOOD LEFT ANTECUBITAL Performed at [facility] [address] [place], [place] [address]    Special Requests   Final    BOTTLES DRAWN AEROBIC AND ANAEROBIC Blood Culture adequate volume Performed at [facility] [address], [place], [place] [address]    Culture   Final    NO GROWTH 3 DAYS Performed at [facility], [place] [address], [place], [place] [address]    Report Status PENDING  Incomplete  MRSA Next Gen by PCR, Nasal     Status: None   Collection Time: 02/13/21  8:52 PM    Specimen: Nasal Mucosa; Nasal Swab  Result Value Ref Range Status   MRSA by PCR Next Gen NOT DETECTED NOT DETECTED Final    Comment: (NOTE) The GeneXpert MRSA Assay (FDA approved for NASAL specimens only), is one component of a comprehensive MRSA colonization surveillance program. It is not intended to diagnose MRSA infection nor to guide or monitor treatment for MRSA infections. Test performance is not FDA approved in patients less than 29 years old. Performed at [facility], [place] [address] [place], [place] [address]           Radiology Studies: DG Chest 2 View  Result Date: 02/15/2021 CLINICAL DATA:  Shortness of breath, chest heaviness with a couple days duration in a 20 year old female. EXAM: CHEST - 2 VIEW COMPARISON:  February 14, 2021. FINDINGS: Trachea is midline. Cardiomediastinal contours and hilar structures are stable. EKG leads project over the chest. No signs of lobar consolidative process. Small LEFT effusion and basilar airspace disease. Fissural fluid in the LEFT chest. Improved aeration at the LEFT lung base. Mild increased interstitial markings at the lung bases. No pneumothorax. On limited assessment there is no acute skeletal process. IMPRESSION: Small LEFT effusion and basilar airspace disease, findings more likely related to infection/pneumonia. Improved aeration at the LEFT lung base since previous imaging. Would also correlate with any signs of heart failure or volume overload given the presence of increased interstitial markings bilaterally along with fissural fluid. Electronically Signed   By: Zetta Bills M.D.   On: 02/15/2021 15:10   CT Angio Chest Pulmonary Embolism (PE) W or WO Contrast  Result Date: 02/16/2021 CLINICAL  DATA:  Shortness of breath EXAM: CT ANGIOGRAPHY CHEST WITH CONTRAST TECHNIQUE: Multidetector CT imaging of the chest was performed using the standard protocol during bolus administration of intravenous contrast.  Multiplanar CT image reconstructions and MIPs were obtained to evaluate the vascular anatomy. CONTRAST:  46mL OMNIPAQUE IOHEXOL 350 MG/ML SOLN COMPARISON:  None. FINDINGS: Cardiovascular: Satisfactory opacification of the pulmonary arteries to the segmental level. No evidence of pulmonary embolism. Normal heart size. No pericardial effusion. Mediastinum/Nodes: Esophagus and thyroid are unremarkable. No pathologically enlarged lymph nodes seen in the chest. Lungs/Pleura: Central airways are patent. Bilateral lower lobe consolidations and interlobular septal thickening. Small bilateral pleural effusions. Upper abdomen: No acute abnormality. Musculoskeletal: No chest wall abnormality. No acute or significant osseous findings. IMPRESSION: 1. No evidence of pulmonary embolus. 2. Bilateral lower lobe consolidations, favored to be due to pulmonary edema, infection or aspiration are additional considerations. 3. Small bilateral pleural effusions. Electronically Signed   By: Yetta Glassman M.D.   On: 02/16/2021 12:51   DG CHEST PORT 1 VIEW  Result Date: 02/14/2021 CLINICAL DATA:  Chest tightness with shortness of breath. EXAM: PORTABLE CHEST 1 VIEW COMPARISON:  Chest x-ray 02/13/2021. FINDINGS: There is a new small left pleural effusion. The right lung is clear. There is no pneumothorax. Cardiomediastinal silhouette is within normal limits. No acute fractures. IMPRESSION: 1. New small left pleural effusion. Electronically Signed   By: Ronney Asters M.D.   On: 02/14/2021 20:29        Scheduled Meds:  Chlorhexidine Gluconate Cloth  6 each Topical Daily   enoxaparin (LOVENOX) injection  40 mg Subcutaneous Q24H   melatonin  3 mg Oral QHS   multivitamin with minerals  1 tablet Oral Daily   senna  1 tablet Oral BID   sodium chloride flush  3 mL Intravenous Q12H   Continuous Infusions:  cefTRIAXone (ROCEPHIN)  IV Stopped (02/16/21 1214)     LOS: 3 days    Time spent: 42 minutes    Hosie Poisson,  MD [facility]   To contact the attending provider between 7A-7P or the covering provider during after hours 7P-7A, please log into the web site [email or web address] and access using universal [place] password for that web site. If you do not have the password, please call the hospital operator.  02/16/2021, 1:29 PM

## 2021-02-17 DIAGNOSIS — A419 Sepsis, unspecified organism: Secondary | ICD-10-CM | POA: Diagnosis not present

## 2021-02-17 DIAGNOSIS — E860 Dehydration: Secondary | ICD-10-CM | POA: Diagnosis not present

## 2021-02-17 DIAGNOSIS — N3000 Acute cystitis without hematuria: Secondary | ICD-10-CM | POA: Diagnosis not present

## 2021-02-17 DIAGNOSIS — N12 Tubulo-interstitial nephritis, not specified as acute or chronic: Secondary | ICD-10-CM | POA: Diagnosis not present

## 2021-02-17 DIAGNOSIS — E872 Acidosis, unspecified: Secondary | ICD-10-CM | POA: Diagnosis not present

## 2021-02-17 DIAGNOSIS — R6521 Severe sepsis with septic shock: Secondary | ICD-10-CM | POA: Diagnosis not present

## 2021-02-17 DIAGNOSIS — E876 Hypokalemia: Secondary | ICD-10-CM | POA: Diagnosis not present

## 2021-02-17 LAB — CBC WITH DIFFERENTIAL/PLATELET
Abs Immature Granulocytes: 0.24 10*3/uL — ABNORMAL HIGH (ref 0.00–0.07)
Basophils Absolute: 0.1 10*3/uL (ref 0.0–0.1)
Basophils Relative: 1 %
Eosinophils Absolute: 0.2 10*3/uL (ref 0.0–0.5)
Eosinophils Relative: 1 %
HCT: 34 % — ABNORMAL LOW (ref 36.0–46.0)
Hemoglobin: 11.4 g/dL — ABNORMAL LOW (ref 12.0–15.0)
Immature Granulocytes: 2 %
Lymphocytes Relative: 18 %
Lymphs Abs: 2.2 10*3/uL (ref 0.7–4.0)
MCH: 29.9 pg (ref 26.0–34.0)
MCHC: 33.5 g/dL (ref 30.0–36.0)
MCV: 89.2 fL (ref 80.0–100.0)
Monocytes Absolute: 0.7 10*3/uL (ref 0.1–1.0)
Monocytes Relative: 6 %
Neutro Abs: 8.7 10*3/uL — ABNORMAL HIGH (ref 1.7–7.7)
Neutrophils Relative %: 72 %
Platelets: 368 10*3/uL (ref 150–400)
RBC: 3.81 MIL/uL — ABNORMAL LOW (ref 3.87–5.11)
RDW: 13.3 % (ref 11.5–15.5)
WBC: 12.1 10*3/uL — ABNORMAL HIGH (ref 4.0–10.5)
nRBC: 0 % (ref 0.0–0.2)

## 2021-02-17 LAB — BASIC METABOLIC PANEL
Anion gap: 9 (ref 5–15)
BUN: 8 mg/dL (ref 6–20)
CO2: 23 mmol/L (ref 22–32)
Calcium: 8.7 mg/dL — ABNORMAL LOW (ref 8.9–10.3)
Chloride: 102 mmol/L (ref 98–111)
Creatinine, Ser: 0.65 mg/dL (ref 0.44–1.00)
GFR, Estimated: >60 mL/min (ref >60)
Glucose, Bld: 99 mg/dL (ref 70–99)
Potassium: 4.1 mmol/L (ref 3.5–5.1)
Sodium: 134 mmol/L — ABNORMAL LOW (ref 135–145)

## 2021-02-17 LAB — PROCALCITONIN: Procalcitonin: 2.32 ng/mL

## 2021-02-17 MED ORDER — ADULT MULTIVITAMIN W/MINERALS CH: 1.0000 | ORAL_TABLET | Freq: Every day | ORAL | 0 refills | Status: AC

## 2021-02-17 MED ORDER — CEPHALEXIN 500 MG PO CAPS: 500.0000 mg | ORAL_CAPSULE | Freq: Four times a day (QID) | ORAL | 0 refills | Status: AC

## 2021-02-17 NOTE — Plan of Care (Signed)

## 2021-02-17 NOTE — Discharge Summary (Signed)
Physician Discharge Summary  Monica Goodman [id number] DOB: 2000/07/16 DOA: 02/13/2021  PCP: Belva Chimes, MD  Admit date: 02/13/2021 Discharge date: 02/17/2021  Admitted From: home.  Disposition:  Home.   Recommendations for Outpatient Follow-up:  Follow up with PCP in 1-2 weeks Please obtain BMP/CBC in one week Please follow up Urology as scheduled.    Discharge Condition:stable.  CODE STATUS:Full code.  Diet recommendation: Heart Healthy   Brief/Interim Summary: 20 year old lady with no significant past medical history presents to ED with fever and chills, myalgias, malodorous urine, right flank pain, shortness of breath, abdominal pain and lightheadedness. CT of the abdomen and pelvis done on admission consistent with acute pyelonephritis of the right kidney with concern for developing abscess.  Patient was also found to be septic and hypotensive.  She was admitted and started on broad-spectrum IV antibiotics.  CCM consulted for hypotensive episodes and Urology on board for acute pyelonephritis.  Discharge Diagnoses:  Principal Problem:   Severe sepsis with septic shock (HCC) Active Problems:   Pyelonephritis   Dehydration   Acute cystitis without hematuria   Hypokalemia   Anemia   Metabolic acidosis  Severe sepsis with septic shock secondary to acute right-sided pyelonephritis/early developing abscess present on admission.   Fevers have resolved, leukocytosis has improved, blood pressure parameters have improved Blood cultures have been ordered and results are pending at this time.  Urine cultures grew E. coli which is pansensitive.  Broad-spectrum IV antibiotics have been transitioned to IV Rocephin. She was discharged on oral keflex to complete the course.  Urology on board , and recommended outpatient follow up .  Repeat CT of the abdomen and pelvis ordered and as per urology, signs of pyelonephritis have improved.  Improving procalcitonin, lactic acid has normalized       Shortness of breath, some chest tightness/acute respiratory failure with hypoxia probably secondary to mild fluid overload Initially thought to be secondary to volume overload as patient received aggressive fluid resuscitation on admission and overnight due to hypotensive episodes. Will stop the fluids at this time and monitor.  Symptoms have improved after IV Lasix.   CT angiogram of the chest ordered to evaluate for PE was negative.  Patient was found to have bilateral  Lobe consolidation probably edema with small bilateral effusions.       Hypokalemia and mild hypomagnesium  Replaced and repeat levels within normal     Mild anemia /normocytic Transfuse to keep hemoglobin greater than 7 Possibly dilutional in nature. Hemoglobin stable around 10         Acute metabolic acidosis Probably secondary to sepsis Bicarb has improved.     Mild AKI Probably secondary to dehydration and sepsis Creatinine is back to baseline at this time.      Discharge Instructions  Discharge Instructions     Diet - low sodium heart healthy   Complete by: As directed    Discharge instructions   Complete by: As directed    Please follow up with Urology as scheduled. Office will call you for appointment.      Allergies as of 02/17/2021   No Known Allergies      Medication List     STOP taking these medications    MUCINEX PO   oseltamivir 75 MG capsule Commonly known as: TAMIFLU       TAKE these medications    cephALEXin 500 MG capsule Commonly known as: KEFLEX Take 1 capsule (500 mg total) by mouth 4 (four) times daily for 9  days.   ibuprofen 200 MG tablet Commonly known as: ADVIL Take 600 mg by mouth every 6 (six) hours as needed for fever or headache (pain).   levonorgestrel-ethinyl estradiol 0.1-20 MG-MCG tablet Commonly known as: ALESSE Take 1 tablet by mouth every morning.   multivitamin with minerals Tabs tablet Take 1 tablet by mouth daily. Start taking  on: February 18, 2021   VICKS VAPORUB EX Apply 1 application topically See admin instructions. Apply topically to chest, upper lip, lower back up to three times daily as needed for cough/congestion   VITAMIN D3 PO Take 1 tablet by mouth daily.        No Known Allergies  Consultations: Urology.    Procedures/Studies: DG Chest 2 View  Result Date: 02/15/2021 CLINICAL DATA:  Shortness of breath, chest heaviness with a couple days duration in a 20 year old female. EXAM: CHEST - 2 VIEW COMPARISON:  February 14, 2021. FINDINGS: Trachea is midline. Cardiomediastinal contours and hilar structures are stable. EKG leads project over the chest. No signs of lobar consolidative process. Small LEFT effusion and basilar airspace disease. Fissural fluid in the LEFT chest. Improved aeration at the LEFT lung base. Mild increased interstitial markings at the lung bases. No pneumothorax. On limited assessment there is no acute skeletal process. IMPRESSION: Small LEFT effusion and basilar airspace disease, findings more likely related to infection/pneumonia. Improved aeration at the LEFT lung base since previous imaging. Would also correlate with any signs of heart failure or volume overload given the presence of increased interstitial markings bilaterally along with fissural fluid. Electronically Signed   By: Zetta Bills M.D.   On: 02/15/2021 15:10   CT Angio Chest Pulmonary Embolism (PE) W or WO Contrast  Result Date: 02/16/2021 CLINICAL DATA:  Shortness of breath EXAM: CT ANGIOGRAPHY CHEST WITH CONTRAST TECHNIQUE: Multidetector CT imaging of the chest was performed using the standard protocol during bolus administration of intravenous contrast. Multiplanar CT image reconstructions and MIPs were obtained to evaluate the vascular anatomy. CONTRAST:  12mL OMNIPAQUE IOHEXOL 350 MG/ML SOLN COMPARISON:  None. FINDINGS: Cardiovascular: Satisfactory opacification of the pulmonary arteries to the segmental level.  No evidence of pulmonary embolism. Normal heart size. No pericardial effusion. Mediastinum/Nodes: Esophagus and thyroid are unremarkable. No pathologically enlarged lymph nodes seen in the chest. Lungs/Pleura: Central airways are patent. Bilateral lower lobe consolidations and interlobular septal thickening. Small bilateral pleural effusions. Upper abdomen: No acute abnormality. Musculoskeletal: No chest wall abnormality. No acute or significant osseous findings. IMPRESSION: 1. No evidence of pulmonary embolus. 2. Bilateral lower lobe consolidations, favored to be due to pulmonary edema, infection or aspiration are additional considerations. 3. Small bilateral pleural effusions. Electronically Signed   By: Yetta Glassman M.D.   On: 02/16/2021 12:51   CT ABDOMEN PELVIS W CONTRAST  Result Date: 02/13/2021 CLINICAL DATA:  RIGHT lower quadrant pain. Flank pain, kidney stone suspected. RIGHT lower quadrant abdominal pain, appendicitis suspected. EXAM: CT ABDOMEN AND PELVIS WITH CONTRAST TECHNIQUE: Multidetector CT imaging of the abdomen and pelvis was performed using the standard protocol following bolus administration of intravenous contrast. CONTRAST:  15mL OMNIPAQUE IOHEXOL 300 MG/ML  SOLN COMPARISON:  None. FINDINGS: Lower chest: No acute abnormality. Hepatobiliary: No focal liver abnormality is seen. Gallbladder appears normal. No bile duct dilatation is seen. Pancreas: Unremarkable. No pancreatic ductal dilatation or surrounding inflammatory changes. Spleen: Normal in size without focal abnormality. Adrenals/Urinary Tract: Adrenal glands are unremarkable. Ill-defined edema throughout the RIGHT renal cortex. More confluent hypodensity within the anterior cortex of the lower  pole, measuring approximately 2 cm greatest dimension, possible early developing abscess. Associated RIGHT perinephric fluid stranding/inflammation. LEFT kidney is unremarkable. No ureteral or bladder calculi are identified. Stomach/Bowel:  No dilated large or small bowel loops are seen. No evidence of bowel wall inflammation. Majority of the appendix is obscured by surrounding bowel loops, but the small visualized portion of the appendix is unremarkable. Vascular/Lymphatic: No significant vascular findings are present. No enlarged abdominal or pelvic lymph nodes. Reproductive: Uterus and bilateral adnexa are unremarkable. Other: No intraperitoneal fluid collection or abscess-like collection is seen. No free intraperitoneal air. Musculoskeletal: No acute or significant osseous findings. IMPRESSION: 1. Acute pyelonephritis of the RIGHT kidney with associated edema throughout the RIGHT renal cortex and prominent perinephric fluid stranding/inflammation. More confluent hypodensity within the lower pole of the RIGHT renal cortex, measuring approximately 2 cm greatest dimension, which could represent confluent edema or possible associated early developing renal abscess collection. Consider short-term follow-up CT to exclude developing abscess. 2. No renal or ureteral calculi. 3. No bowel obstruction or evidence of bowel wall inflammation. No evidence of appendicitis. No free intraperitoneal air. Electronically Signed   By: Bary Richard M.D.   On: 02/13/2021 13:51   DG CHEST PORT 1 VIEW  Result Date: 02/14/2021 CLINICAL DATA:  Chest tightness with shortness of breath. EXAM: PORTABLE CHEST 1 VIEW COMPARISON:  Chest x-ray 02/13/2021. FINDINGS: There is a new small left pleural effusion. The right lung is clear. There is no pneumothorax. Cardiomediastinal silhouette is within normal limits. No acute fractures. IMPRESSION: 1. New small left pleural effusion. Electronically Signed   By: Darliss Cheney M.D.   On: 02/14/2021 20:29   DG Chest Portable 1 View  Result Date: 02/13/2021 CLINICAL DATA:  Cough EXAM: PORTABLE CHEST 1 VIEW COMPARISON:  None. FINDINGS: The heart size and mediastinal contours are within normal limits. Both lungs are clear. The  visualized skeletal structures are unremarkable. IMPRESSION: No active disease. Electronically Signed   By: Allegra Lai M.D.   On: 02/13/2021 12:17      Subjective: No new complaints.   Discharge Exam: Vitals:   02/17/21 0835 02/17/21 0844  BP: 122/67   Pulse: 78   Resp: 18   Temp:  (!) 97.3 F (36.3 C)  SpO2: 99%    Vitals:   02/17/21 0352 02/17/21 0500 02/17/21 0835 02/17/21 0844  BP:   122/67   Pulse:   78   Resp:   18   Temp: 99.7 F (37.6 C)   (!) 97.3 F (36.3 C)  TempSrc: Oral   Oral  SpO2:   99%   Weight:  57.1 kg    Height:        General: Pt is alert, awake, not in acute distress Cardiovascular: RRR, S1/S2 +, no rubs, no gallops Respiratory: CTA bilaterally, no wheezing, no rhonchi Abdominal: Soft, NT, ND, bowel sounds + Extremities: no edema, no cyanosis    The results of significant diagnostics from this hospitalization (including imaging, microbiology, ancillary and laboratory) are listed below for reference.     Microbiology: Recent Results (from the past 240 hour(s))  Resp Panel by RT-PCR (Flu A&B, Covid) Nasopharyngeal Swab     Status: None   Collection Time: 02/13/21  9:58 AM   Specimen: Nasopharyngeal Swab; Nasopharyngeal(NP) swabs in vial transport medium  Result Value Ref Range Status   SARS Coronavirus 2 by RT PCR NEGATIVE NEGATIVE Final    Comment: (NOTE) SARS-CoV-2 target nucleic acids are NOT DETECTED.  The SARS-CoV-2 RNA is  generally detectable in upper respiratory specimens during the acute phase of infection. The lowest concentration of SARS-CoV-2 viral copies this assay can detect is 138 copies/mL. A negative result does not preclude SARS-Cov-2 infection and should not be used as the sole basis for treatment or other patient management decisions. A negative result [place] occur with  improper specimen collection/handling, submission of specimen other than nasopharyngeal swab, presence of viral mutation(s) within the areas  targeted by this assay, and inadequate number of viral copies(<138 copies/mL). A negative result must be combined with clinical observations, patient history, and epidemiological information. The expected result is Negative.  Fact Sheet for Patients:  BloggerCourse.com  Fact Sheet for Healthcare Providers:  SeriousBroker.it  This test is no t yet approved or cleared by the [place] FDA and  has been authorized for detection and/or diagnosis of SARS-CoV-2 by FDA under an Emergency Use Authorization (EUA). This EUA will remain  in effect (meaning this test can be used) for the duration of the COVID-19 declaration under Section 564(b)(1) of the Act, 21 U.S.C.section 360bbb-3(b)(1), unless the authorization is terminated  or revoked sooner.       Influenza A by PCR NEGATIVE NEGATIVE Final   Influenza B by PCR NEGATIVE NEGATIVE Final    Comment: (NOTE) The Xpert Xpress SARS-CoV-2/FLU/RSV plus assay is intended as an aid in the diagnosis of influenza from Nasopharyngeal swab specimens and should not be used as a sole basis for treatment. Nasal washings and aspirates are unacceptable for Xpert Xpress SARS-CoV-2/FLU/RSV testing.  Fact Sheet for Patients: BloggerCourse.com  Fact Sheet for Healthcare Providers: SeriousBroker.it  This test is not yet approved or cleared by the [place] FDA and has been authorized for detection and/or diagnosis of SARS-CoV-2 by FDA under an Emergency Use Authorization (EUA). This EUA will remain in effect (meaning this test can be used) for the duration of the COVID-19 declaration under Section 564(b)(1) of the Act, 21 U.S.C. section 360bbb-3(b)(1), unless the authorization is terminated or revoked.  Performed at [facility], [address], [place], [place] [address]   Urine Culture     Status: Abnormal   Collection  Time: 02/13/21 12:36 PM   Specimen: Urine, Clean Catch  Result Value Ref Range Status   Specimen Description   Final    URINE, CLEAN CATCH Performed at [facility], [address], [place], [place] [address]    Special Requests   Final    NONE Performed at [facility], [address], [place], [place] [address]    Culture >=100,000 COLONIES/mL ESCHERICHIA COLI (A)  Final   Report Status 02/15/2021 FINAL  Final   Organism ID, Bacteria ESCHERICHIA COLI (A)  Final      Susceptibility   Escherichia coli - MIC*    AMPICILLIN 8 SENSITIVE Sensitive     CEFAZOLIN <=4 SENSITIVE Sensitive     CEFEPIME <=0.12 SENSITIVE Sensitive     CEFTRIAXONE <=0.25 SENSITIVE Sensitive     CIPROFLOXACIN <=0.25 SENSITIVE Sensitive     GENTAMICIN <=1 SENSITIVE Sensitive     IMIPENEM <=0.25 SENSITIVE Sensitive     NITROFURANTOIN <=16 SENSITIVE Sensitive     TRIMETH/SULFA <=20 SENSITIVE Sensitive     AMPICILLIN/SULBACTAM <=2 SENSITIVE Sensitive     PIP/TAZO <=4 SENSITIVE Sensitive     * >=100,000 COLONIES/mL ESCHERICHIA COLI  Blood Culture (routine x 2)     Status: None (Preliminary result)   Collection Time: 02/13/21  2:20 PM   Specimen: BLOOD  Result Value  Ref Range Status   Specimen Description BLOOD RIGHT ANTECUBITAL  Final   Special Requests   Final    BOTTLES DRAWN AEROBIC AND ANAEROBIC Blood Culture adequate volume   Culture   Final    NO GROWTH 4 DAYS Performed at [facility], [address], [place], [place] [address]    Report Status PENDING  Incomplete  Blood Culture (routine x 2)     Status: None (Preliminary result)   Collection Time: 02/13/21  6:26 PM   Specimen: BLOOD  Result Value Ref Range Status   Specimen Description   Final    BLOOD LEFT ANTECUBITAL Performed at [facility] [address], [place], [place] [address]    Special Requests   Final    BOTTLES DRAWN AEROBIC AND ANAEROBIC Blood Culture adequate  volume Performed at [facility] [address], [place], [place] [address]    Culture   Final    NO GROWTH 4 DAYS Performed at [facility], [place] [address] [place], [place] [address]    Report Status PENDING  Incomplete  MRSA Next Gen by PCR, Nasal     Status: None   Collection Time: 02/13/21  8:52 PM   Specimen: Nasal Mucosa; Nasal Swab  Result Value Ref Range Status   MRSA by PCR Next Gen NOT DETECTED NOT DETECTED Final    Comment: (NOTE) The GeneXpert MRSA Assay (FDA approved for NASAL specimens only), is one component of a comprehensive MRSA colonization surveillance program. It is not intended to diagnose MRSA infection nor to guide or monitor treatment for MRSA infections. Test performance is not FDA approved in patients less than 18 years old. Performed at [facility], [place] [address], [place], [place] [address]      Labs: BNP (last 3 results) No results for input(s): BNP in the last 8760 hours. Basic Metabolic Panel: Recent Labs  Lab 02/13/21 1200 02/14/21 0246 02/15/21 0257 02/15/21 0856 02/15/21 1824 02/16/21 0245 02/17/21 0249  NA 132* 136  --  135  --  136 134*  K 4.0 3.2*  --  2.7* 3.5 4.2 4.1  CL 97* 110  --  100  --  104 102  CO2 22 17*  --  24  --  23 23  GLUCOSE 89 119*  --  100*  --  91 99  BUN 16 11  --  8  --  7 8  CREATININE 1.28* 0.75  --  0.70  --  0.72 0.65  CALCIUM 9.4 7.8*  --  8.0*  --  8.6* 8.7*  MG  --  1.8 2.1  --   --   --   --   PHOS  --  3.1  --   --   --   --   --    Liver Function Tests: Recent Labs  Lab 02/13/21 1200 02/14/21 0246 02/15/21 0856  AST 19 18 19   ALT 19 18 20   ALKPHOS 57 56 73  BILITOT 0.5 0.4 0.7  PROT 7.0 5.0* 5.8*  ALBUMIN 3.7 2.1* 2.6*   Recent Labs  Lab 02/13/21 1200  LIPASE <10*   No results for input(s): AMMONIA in the last 168 hours. CBC: Recent Labs  Lab 02/13/21 1200 02/14/21 0246 02/15/21 0856 02/16/21 0245 02/17/21 0249  WBC 21.5* 15.2*  13.3* 10.5 12.1*  NEUTROABS 18.2* 11.8* 11.0* 7.2 8.7*  HGB 12.4 9.8* 10.2* 10.7* 11.4*  HCT 36.7 29.5* 30.2* 32.0*  34.0*  MCV 87.8 92.5 88.0 89.1 89.2  PLT 227 185 226 262 368   Cardiac Enzymes: No results for input(s): CKTOTAL, CKMB, CKMBINDEX, TROPONINI in the last 168 hours. BNP: Invalid input(s): POCBNP CBG: Recent Labs  Lab 02/14/21 0831 02/15/21 0616 02/16/21 0833  GLUCAP 102* 73 79   D-Dimer Recent Labs    02/15/21 1407  DDIMER 2.25*   Hgb A1c No results for input(s): HGBA1C in the last 72 hours. Lipid Profile No results for input(s): CHOL, HDL, LDLCALC, TRIG, CHOLHDL, LDLDIRECT in the last 72 hours. Thyroid function studies No results for input(s): TSH, T4TOTAL, T3FREE, THYROIDAB in the last 72 hours.  Invalid input(s): FREET3 Anemia work up Recent Labs    02/14/21 1045  VITAMINB12 688  FOLATE 7.9  FERRITIN 318*  TIBC 207*  IRON 8*   Urinalysis    Component Value Date/Time   COLORURINE YELLOW 02/13/2021 1203   APPEARANCEUR CLOUDY (A) 02/13/2021 1203   LABSPEC 1.018 02/13/2021 1203   PHURINE 5.5 02/13/2021 1203   GLUCOSEU NEGATIVE 02/13/2021 1203   HGBUR MODERATE (A) 02/13/2021 [place] 02/13/2021 1203   [place] 02/13/2021 1203   PROTEINUR 100 (A) 02/13/2021 1203   NITRITE NEGATIVE 02/13/2021 1203   LEUKOCYTESUR LARGE (A) 02/13/2021 1203   Sepsis Labs Invalid input(s): PROCALCITONIN,  WBC,  LACTICIDVEN Microbiology Recent Results (from the past 240 hour(s))  Resp Panel by RT-PCR (Flu A&B, Covid) Nasopharyngeal Swab     Status: None   Collection Time: 02/13/21  9:58 AM   Specimen: Nasopharyngeal Swab; Nasopharyngeal(NP) swabs in vial transport medium  Result Value Ref Range Status   SARS Coronavirus 2 by RT PCR NEGATIVE NEGATIVE Final    Comment: (NOTE) SARS-CoV-2 target nucleic acids are NOT DETECTED.  The SARS-CoV-2 RNA is generally detectable in upper respiratory specimens during the acute phase of  infection. The lowest concentration of SARS-CoV-2 viral copies this assay can detect is 138 copies/mL. A negative result does not preclude SARS-Cov-2 infection and should not be used as the sole basis for treatment or other patient management decisions. A negative result [place] occur with  improper specimen collection/handling, submission of specimen other than nasopharyngeal swab, presence of viral mutation(s) within the areas targeted by this assay, and inadequate number of viral copies(<138 copies/mL). A negative result must be combined with clinical observations, patient history, and epidemiological information. The expected result is Negative.  Fact Sheet for Patients:  EntrepreneurPulse.com.au  Fact Sheet for Healthcare Providers:  IncredibleEmployment.be  This test is no t yet approved or cleared by the [place] FDA and  has been authorized for detection and/or diagnosis of SARS-CoV-2 by FDA under an Emergency Use Authorization (EUA). This EUA will remain  in effect (meaning this test can be used) for the duration of the COVID-19 declaration under Section 564(b)(1) of the Act, 21 U.S.C.section 360bbb-3(b)(1), unless the authorization is terminated  or revoked sooner.       Influenza A by PCR NEGATIVE NEGATIVE Final   Influenza B by PCR NEGATIVE NEGATIVE Final    Comment: (NOTE) The Xpert Xpress SARS-CoV-2/FLU/RSV plus assay is intended as an aid in the diagnosis of influenza from Nasopharyngeal swab specimens and should not be used as a sole basis for treatment. Nasal washings and aspirates are unacceptable for Xpert Xpress SARS-CoV-2/FLU/RSV testing.  Fact Sheet for Patients: EntrepreneurPulse.com.au  Fact Sheet for Healthcare Providers: IncredibleEmployment.be  This test is not yet approved or cleared by the [place] and has been authorized  for detection and/or diagnosis of SARS-CoV-2  by FDA under an Emergency Use Authorization (EUA). This EUA will remain in effect (meaning this test can be used) for the duration of the COVID-19 declaration under Section 564(b)(1) of the Act, 21 U.S.C. section 360bbb-3(b)(1), unless the authorization is terminated or revoked.  Performed at [facility], [address] [place], [place] [address]   Urine Culture     Status: Abnormal   Collection Time: 02/13/21 12:36 PM   Specimen: Urine, Clean Catch  Result Value Ref Range Status   Specimen Description   Final    URINE, CLEAN CATCH Performed at [facility], [address], [place], [place] [address]    Special Requests   Final    NONE Performed at [facility], [address], [place], [place] [address]    Culture >=100,000 COLONIES/mL ESCHERICHIA COLI (A)  Final   Report Status 02/15/2021 FINAL  Final   Organism ID, Bacteria ESCHERICHIA COLI (A)  Final      Susceptibility   Escherichia coli - MIC*    AMPICILLIN 8 SENSITIVE Sensitive     CEFAZOLIN <=4 SENSITIVE Sensitive     CEFEPIME <=0.12 SENSITIVE Sensitive     CEFTRIAXONE <=0.25 SENSITIVE Sensitive     CIPROFLOXACIN <=0.25 SENSITIVE Sensitive     GENTAMICIN <=1 SENSITIVE Sensitive     IMIPENEM <=0.25 SENSITIVE Sensitive     NITROFURANTOIN <=16 SENSITIVE Sensitive     TRIMETH/SULFA <=20 SENSITIVE Sensitive     AMPICILLIN/SULBACTAM <=2 SENSITIVE Sensitive     PIP/TAZO <=4 SENSITIVE Sensitive     * >=100,000 COLONIES/mL ESCHERICHIA COLI  Blood Culture (routine x 2)     Status: None (Preliminary result)   Collection Time: 02/13/21  2:20 PM   Specimen: BLOOD  Result Value Ref Range Status   Specimen Description BLOOD RIGHT ANTECUBITAL  Final   Special Requests   Final    BOTTLES DRAWN AEROBIC AND ANAEROBIC Blood Culture adequate volume   Culture   Final    NO GROWTH 4 DAYS Performed at [facility], [address], [place], [place] [address]     Report Status PENDING  Incomplete  Blood Culture (routine x 2)     Status: None (Preliminary result)   Collection Time: 02/13/21  6:26 PM   Specimen: BLOOD  Result Value Ref Range Status   Specimen Description   Final    BLOOD LEFT ANTECUBITAL Performed at [facility] [address], [place], [place] [address]    Special Requests   Final    BOTTLES DRAWN AEROBIC AND ANAEROBIC Blood Culture adequate volume Performed at [facility] [address] [place], [place] [address]    Culture   Final    NO GROWTH 4 DAYS Performed at [facility], [place] [address], [place], [place] [address]    Report Status PENDING  Incomplete  MRSA Next Gen by PCR, Nasal     Status: None   Collection Time: 02/13/21  8:52 PM   Specimen: Nasal Mucosa; Nasal Swab  Result Value Ref Range Status   MRSA by PCR Next Gen NOT DETECTED NOT DETECTED Final    Comment: (NOTE) The GeneXpert MRSA Assay (FDA approved for NASAL specimens only), is one component of a comprehensive MRSA colonization surveillance program. It is not intended to diagnose MRSA infection nor to guide or monitor treatment for MRSA infections. Test performance is not FDA approved in patients less than 2 years  old. Performed at [facility], [place] [address], [place], [place] [address]      Time coordinating discharge: 38 minutes.   SIGNED:   Hosie Poisson, MD  [facility] [facility] 02/17/2021, 10:24 AM

## 2021-02-18 LAB — CULTURE, BLOOD (ROUTINE X 2)
Culture: NO GROWTH
Culture: NO GROWTH
Special Requests: ADEQUATE
Special Requests: ADEQUATE

## 2021-02-24 DIAGNOSIS — N10 Acute tubulo-interstitial nephritis: Secondary | ICD-10-CM | POA: Diagnosis not present

## 2021-03-03 DIAGNOSIS — N10 Acute tubulo-interstitial nephritis: Secondary | ICD-10-CM | POA: Diagnosis not present

## 2021-03-13 DIAGNOSIS — J029 Acute pharyngitis, unspecified: Secondary | ICD-10-CM | POA: Diagnosis not present

## 2021-03-19 DIAGNOSIS — Z419 Encounter for procedure for purposes other than remedying health state, unspecified: Secondary | ICD-10-CM | POA: Diagnosis not present

## 2021-03-30 DIAGNOSIS — Z0000 Encounter for general adult medical examination without abnormal findings: Secondary | ICD-10-CM | POA: Diagnosis not present

## 2021-03-30 DIAGNOSIS — F419 Anxiety disorder, unspecified: Secondary | ICD-10-CM | POA: Diagnosis not present

## 2021-03-30 DIAGNOSIS — Z713 Dietary counseling and surveillance: Secondary | ICD-10-CM | POA: Diagnosis not present

## 2021-03-30 DIAGNOSIS — Z1331 Encounter for screening for depression: Secondary | ICD-10-CM | POA: Diagnosis not present

## 2021-03-30 DIAGNOSIS — Z3202 Encounter for pregnancy test, result negative: Secondary | ICD-10-CM | POA: Diagnosis not present

## 2021-03-30 DIAGNOSIS — Z68.41 Body mass index (BMI) pediatric, 5th percentile to less than 85th percentile for age: Secondary | ICD-10-CM | POA: Diagnosis not present

## 2021-04-19 DIAGNOSIS — Z419 Encounter for procedure for purposes other than remedying health state, unspecified: Secondary | ICD-10-CM | POA: Diagnosis not present

## 2021-05-17 DIAGNOSIS — Z419 Encounter for procedure for purposes other than remedying health state, unspecified: Secondary | ICD-10-CM | POA: Diagnosis not present

## 2021-06-17 DIAGNOSIS — Z419 Encounter for procedure for purposes other than remedying health state, unspecified: Secondary | ICD-10-CM | POA: Diagnosis not present

## 2021-07-17 DIAGNOSIS — Z419 Encounter for procedure for purposes other than remedying health state, unspecified: Secondary | ICD-10-CM | POA: Diagnosis not present

## 2021-08-17 DIAGNOSIS — Z419 Encounter for procedure for purposes other than remedying health state, unspecified: Secondary | ICD-10-CM | POA: Diagnosis not present

## 2021-09-16 DIAGNOSIS — Z419 Encounter for procedure for purposes other than remedying health state, unspecified: Secondary | ICD-10-CM | POA: Diagnosis not present

## 2021-10-17 DIAGNOSIS — Z419 Encounter for procedure for purposes other than remedying health state, unspecified: Secondary | ICD-10-CM | POA: Diagnosis not present

## 2021-11-17 DIAGNOSIS — Z419 Encounter for procedure for purposes other than remedying health state, unspecified: Secondary | ICD-10-CM | POA: Diagnosis not present

## 2021-12-17 DIAGNOSIS — Z419 Encounter for procedure for purposes other than remedying health state, unspecified: Secondary | ICD-10-CM | POA: Diagnosis not present

## 2022-01-17 DIAGNOSIS — Z419 Encounter for procedure for purposes other than remedying health state, unspecified: Secondary | ICD-10-CM | POA: Diagnosis not present

## 2022-02-16 DIAGNOSIS — Z419 Encounter for procedure for purposes other than remedying health state, unspecified: Secondary | ICD-10-CM | POA: Diagnosis not present

## 2022-03-19 DIAGNOSIS — Z419 Encounter for procedure for purposes other than remedying health state, unspecified: Secondary | ICD-10-CM | POA: Diagnosis not present

## 2022-04-19 DIAGNOSIS — Z419 Encounter for procedure for purposes other than remedying health state, unspecified: Secondary | ICD-10-CM | POA: Diagnosis not present

## 2022-05-18 DIAGNOSIS — Z419 Encounter for procedure for purposes other than remedying health state, unspecified: Secondary | ICD-10-CM | POA: Diagnosis not present

## 2022-06-18 DIAGNOSIS — Z419 Encounter for procedure for purposes other than remedying health state, unspecified: Secondary | ICD-10-CM | POA: Diagnosis not present

## 2022-07-18 DIAGNOSIS — Z419 Encounter for procedure for purposes other than remedying health state, unspecified: Secondary | ICD-10-CM | POA: Diagnosis not present

## 2022-08-18 DIAGNOSIS — Z419 Encounter for procedure for purposes other than remedying health state, unspecified: Secondary | ICD-10-CM | POA: Diagnosis not present

## 2022-09-01 ENCOUNTER — Other Ambulatory Visit: Payer: Self-pay

## 2022-09-01 ENCOUNTER — Emergency Department (HOSPITAL_BASED_OUTPATIENT_CLINIC_OR_DEPARTMENT_OTHER)
Admission: EM | Admit: 2022-09-01 | Discharge: 2022-09-02 | Disposition: A | Discharge status: Home / Self Care | Payer: Commercial Managed Care - HMO | Attending: Emergency Medicine | Admitting: Emergency Medicine

## 2022-09-01 ENCOUNTER — Encounter (HOSPITAL_BASED_OUTPATIENT_CLINIC_OR_DEPARTMENT_OTHER): Payer: Self-pay | Admitting: *Deleted

## 2022-09-01 DIAGNOSIS — M545 Low back pain, unspecified: Secondary | ICD-10-CM | POA: Insufficient documentation

## 2022-09-01 DIAGNOSIS — M5459 Other low back pain: Secondary | ICD-10-CM | POA: Diagnosis not present

## 2022-09-01 LAB — URINALYSIS, ROUTINE W REFLEX MICROSCOPIC
Bilirubin Urine: NEGATIVE
Glucose, UA: NEGATIVE mg/dL
Hgb urine dipstick: NEGATIVE
Ketones, ur: NEGATIVE mg/dL
Leukocytes,Ua: NEGATIVE
Nitrite: NEGATIVE
Protein, ur: NEGATIVE mg/dL
Specific Gravity, Urine: 1.005 (ref 1.005–1.030)
pH: 6.5 (ref 5.0–8.0)

## 2022-09-01 LAB — PREGNANCY, URINE: Preg Test, Ur: NEGATIVE

## 2022-09-01 NOTE — ED Notes (Signed)
Pt reports that pain increases with movement

## 2022-09-01 NOTE — ED Triage Notes (Signed)
Pt is here for lower back pain which began about 6 hours ago.  Pt is tearful in triage as she is concerned about a UTI.  No recent injury. No pain or burning with urination

## 2022-09-02 DIAGNOSIS — M545 Low back pain, unspecified: Secondary | ICD-10-CM | POA: Diagnosis not present

## 2022-09-02 MED ORDER — NAPROXEN 250 MG PO TABS
500.0000 mg | ORAL_TABLET | Freq: Once | ORAL | Status: AC
  Administered 2022-09-02: 500 mg via ORAL
  Filled 2022-09-02: qty 2

## 2022-09-02 MED ORDER — METHOCARBAMOL 500 MG PO TABS
500.0000 mg | ORAL_TABLET | Freq: Once | ORAL | Status: AC
  Administered 2022-09-02: 500 mg via ORAL
  Filled 2022-09-02: qty 1

## 2022-09-02 MED ORDER — NAPROXEN 500 MG PO TABS: 500.0000 mg | ORAL_TABLET | Freq: Two times a day (BID) | ORAL | 0 refills | Status: AC

## 2022-09-02 MED ORDER — METHOCARBAMOL 500 MG PO TABS: 500.0000 mg | ORAL_TABLET | Freq: Two times a day (BID) | ORAL | 0 refills | Status: AC

## 2022-09-02 NOTE — ED Provider Notes (Signed)
Monica Goodman  Provider Note  CSN: [id number] Arrival date & time: 09/01/22 2056  History Chief Complaint  Patient presents with   Back Pain    Monica Goodman is a 22 y.o. female with prior history of pyelonephritis and sepsis reports onset of bilateral lower back pain around 1300hrs today. Comes and goes, worse with movement. No much improvement with Motrin OTC. She has not been having any urinary symptoms but is concerned about recurrence of UTI/pyelonephritis.    Home Medications Prior to Admission medications   Medication Sig Start Date End Date Taking? Authorizing Provider  Camphor-Eucalyptus-Menthol (VICKS VAPORUB EX) Apply 1 application topically See admin instructions. Apply topically to chest, upper lip, lower back up to three times daily as needed for cough/congestion    [provider]  Cholecalciferol (VITAMIN D3 PO) Take 1 tablet by mouth daily.    [provider]  ibuprofen (ADVIL) 200 MG tablet Take 600 mg by mouth every 6 (six) hours as needed for fever or headache (pain).    [provider]  levonorgestrel-ethinyl estradiol (ALESSE) 0.1-20 MG-MCG tablet Take 1 tablet by mouth every morning.    [provider]  Multiple Vitamin (MULTIVITAMIN WITH MINERALS) TABS tablet Take 1 tablet by mouth daily. 02/18/21   Kathlen Mody, MD     Allergies    Patient has no known allergies.   Review of Systems   Review of Systems Please see HPI for pertinent positives and negatives  Physical Exam BP (!) 156/84   Pulse 71   Temp 99.1 F (37.3 C) (Oral)   Resp 16   Wt 56.7 kg   SpO2 100%   BMI 22.86 kg/m   Physical Exam Vitals and nursing note reviewed.  Constitutional:      Appearance: Normal appearance.  HENT:     Head: Normocephalic and atraumatic.     Nose: Nose normal.     Mouth/Throat:     Mouth: Mucous membranes are moist.  Eyes:     Extraocular Movements: Extraocular movements  intact.     Conjunctiva/sclera: Conjunctivae normal.  Cardiovascular:     Rate and Rhythm: Normal rate.  Pulmonary:     Effort: Pulmonary effort is normal.     Breath sounds: Normal breath sounds.  Abdominal:     General: Abdomen is flat.     Palpations: Abdomen is soft.     Tenderness: There is no abdominal tenderness. There is no guarding.  Musculoskeletal:        General: Tenderness (bilateral lumbar paraspinal muscles) present. No swelling. Normal range of motion.     Cervical back: Neck supple.  Skin:    General: Skin is warm and dry.  Neurological:     General: No focal deficit present.     Mental Status: She is alert.  Psychiatric:        Mood and Affect: Mood normal.     ED Results / Procedures / Treatments   EKG None  Procedures Procedures  Medications Ordered in the ED Medications  naproxen (NAPROSYN) tablet 500 mg (has no administration in time range)  methocarbamol (ROBAXIN) tablet 500 mg (has no administration in time range)    Initial Impression and Plan  Patient here with low back pain, specifically concerned about UTI given history of same. Vitals are reassuring. Labs done in triage show normal UA and neg HCG. Patient reassured no signs of UTI. Offered symptomatic medications for presumed MSK back pain. Recommend PCP follow  up, RTED for any other concerns.    ED Course       MDM Rules/Calculators/A&P Medical Decision Making Problems Addressed: Acute bilateral low back pain without sciatica: acute illness or injury  Amount and/or Complexity of Data Reviewed Labs: ordered. Decision-making details documented in ED Course.  Risk Prescription drug management.     Final Clinical Impression(s) / ED Diagnoses Final diagnoses:  Acute bilateral low back pain without sciatica    Rx / DC Orders ED Discharge Orders     None        Pollyann Savoy, MD 09/02/22 [phone]

## 2022-09-11 DIAGNOSIS — J020 Streptococcal pharyngitis: Secondary | ICD-10-CM | POA: Diagnosis not present

## 2022-09-17 DIAGNOSIS — Z419 Encounter for procedure for purposes other than remedying health state, unspecified: Secondary | ICD-10-CM | POA: Diagnosis not present

## 2022-10-18 DIAGNOSIS — Z419 Encounter for procedure for purposes other than remedying health state, unspecified: Secondary | ICD-10-CM | POA: Diagnosis not present

## 2022-11-18 DIAGNOSIS — Z419 Encounter for procedure for purposes other than remedying health state, unspecified: Secondary | ICD-10-CM | POA: Diagnosis not present

## 2022-12-18 DIAGNOSIS — Z419 Encounter for procedure for purposes other than remedying health state, unspecified: Secondary | ICD-10-CM | POA: Diagnosis not present

## 2023-01-18 DIAGNOSIS — Z419 Encounter for procedure for purposes other than remedying health state, unspecified: Secondary | ICD-10-CM | POA: Diagnosis not present

## 2023-02-17 DIAGNOSIS — Z419 Encounter for procedure for purposes other than remedying health state, unspecified: Secondary | ICD-10-CM | POA: Diagnosis not present

## 2023-03-20 DIAGNOSIS — Z419 Encounter for procedure for purposes other than remedying health state, unspecified: Secondary | ICD-10-CM | POA: Diagnosis not present

## 2023-04-20 DIAGNOSIS — Z419 Encounter for procedure for purposes other than remedying health state, unspecified: Secondary | ICD-10-CM | POA: Diagnosis not present

## 2023-05-18 DIAGNOSIS — Z419 Encounter for procedure for purposes other than remedying health state, unspecified: Secondary | ICD-10-CM | POA: Diagnosis not present

## 2023-06-29 DIAGNOSIS — Z419 Encounter for procedure for purposes other than remedying health state, unspecified: Secondary | ICD-10-CM | POA: Diagnosis not present

## 2023-07-29 DIAGNOSIS — Z419 Encounter for procedure for purposes other than remedying health state, unspecified: Secondary | ICD-10-CM | POA: Diagnosis not present

## 2023-08-29 DIAGNOSIS — Z419 Encounter for procedure for purposes other than remedying health state, unspecified: Secondary | ICD-10-CM | POA: Diagnosis not present

## 2023-09-16 IMAGING — CT CT ANGIO CHEST
2 of 7 series · 19 of 46 positions shown · IV contrast (OMNIPAQUE)
Comparison: None.

CLINICAL DATA: Shortness of breath

EXAM:
CT ANGIOGRAPHY CHEST WITH CONTRAST
TECHNIQUE: Multidetector CT imaging of the chest was performed using the
standard protocol during bolus administration of intravenous
contrast. Multiplanar CT image reconstructions and MIPs were
obtained to evaluate the vascular anatomy.
CONTRAST:  80mL OMNIPAQUE IOHEXOL 350 MG/ML SOLN

[Series 3: thins · axial · 0.71mm/px · z∈[-210,+58]mm · 16 of 303 slices shown]
[im 18/303  lung]
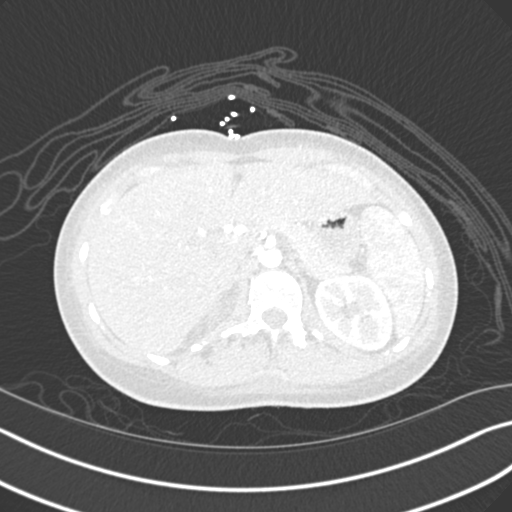
[im 36/303  soft-tissue]
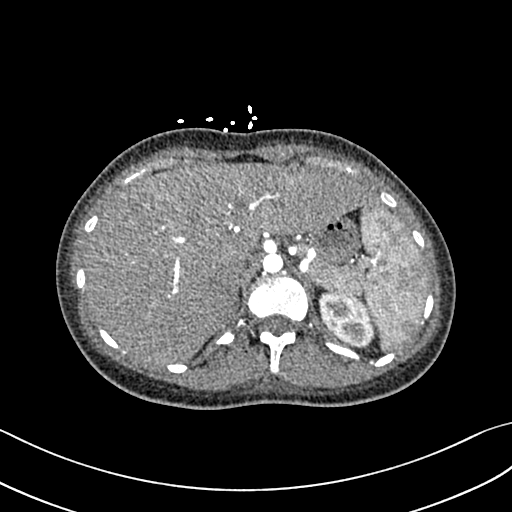
[im 54/303  lung]
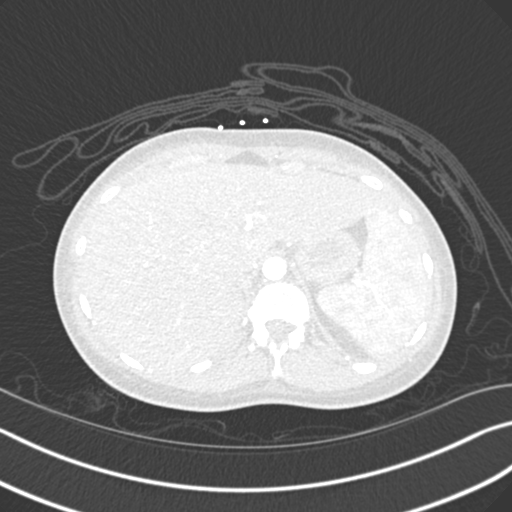
[im 72/303  soft-tissue]
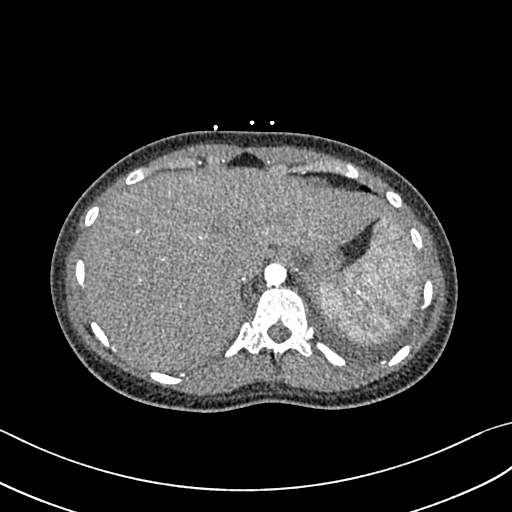
[im 89/303  lung]
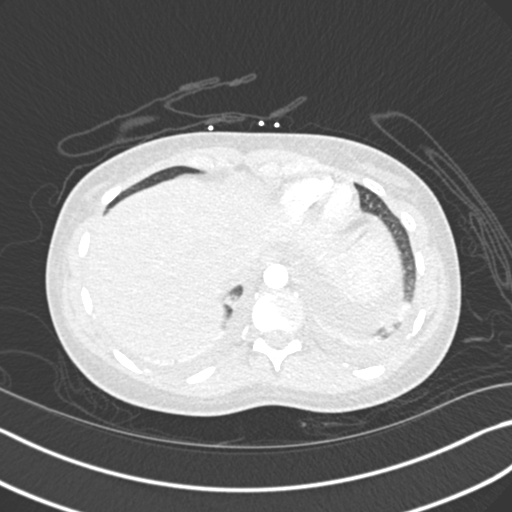
[im 107/303  soft-tissue]
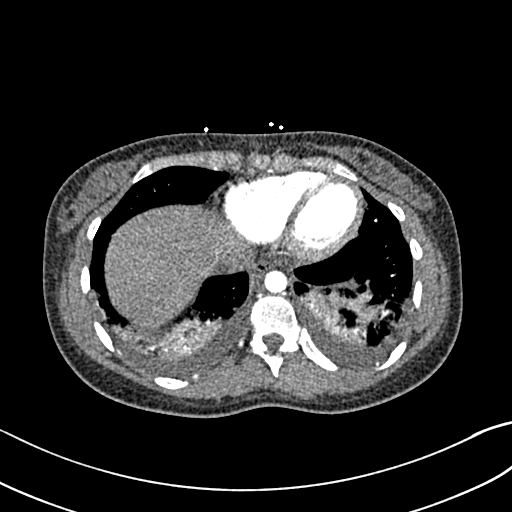
[im 125/303  lung]
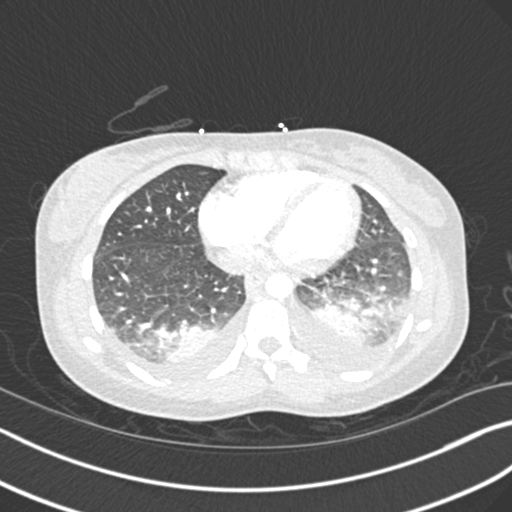
[im 143/303  soft-tissue]
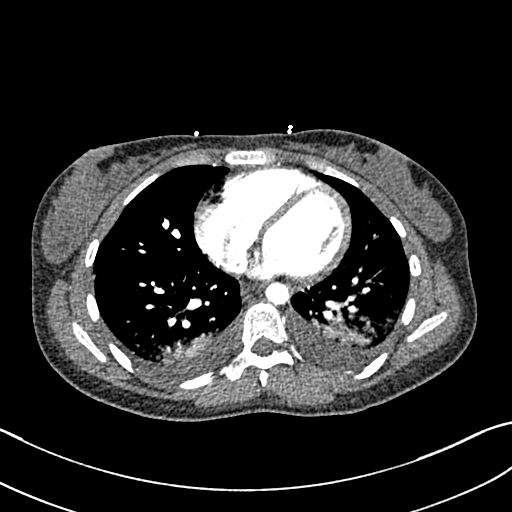
[im 160/303  lung]
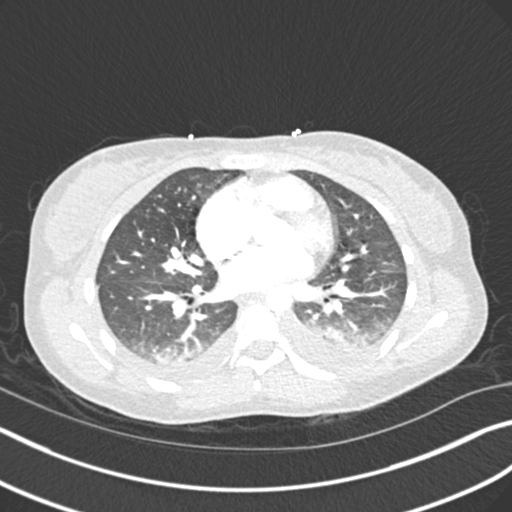
[im 178/303  soft-tissue]
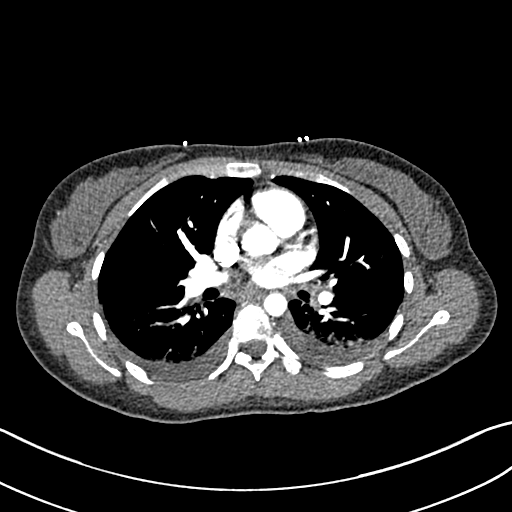
[im 196/303  lung]
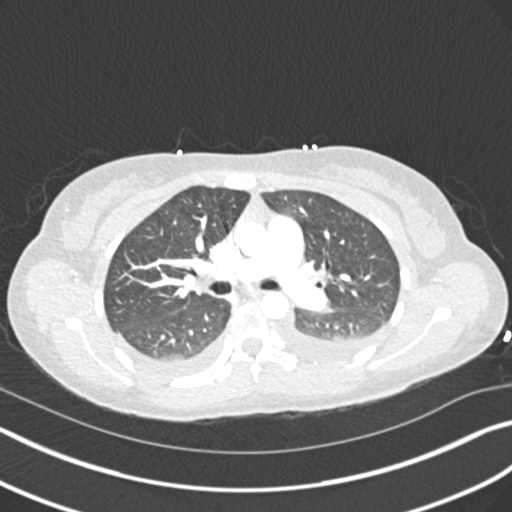
[im 214/303  soft-tissue]
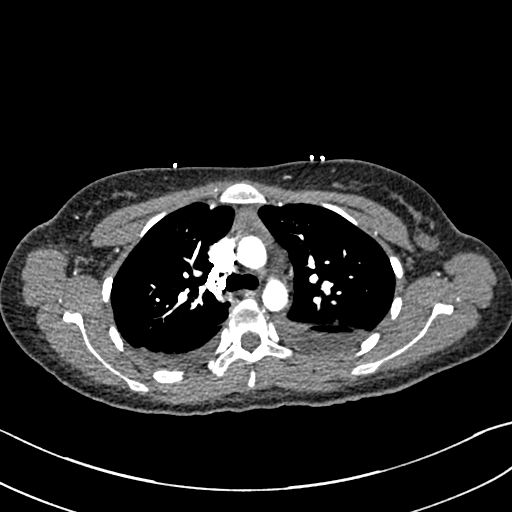
[im 231/303  lung]
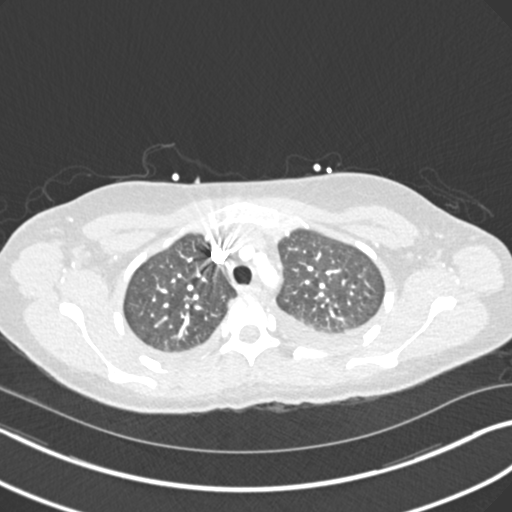
[im 249/303  soft-tissue]
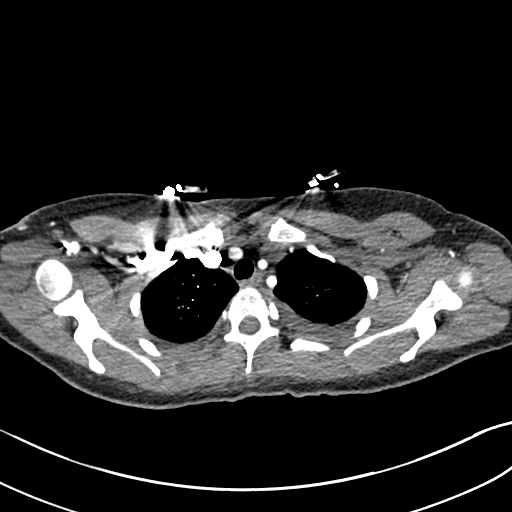
[im 267/303  lung]
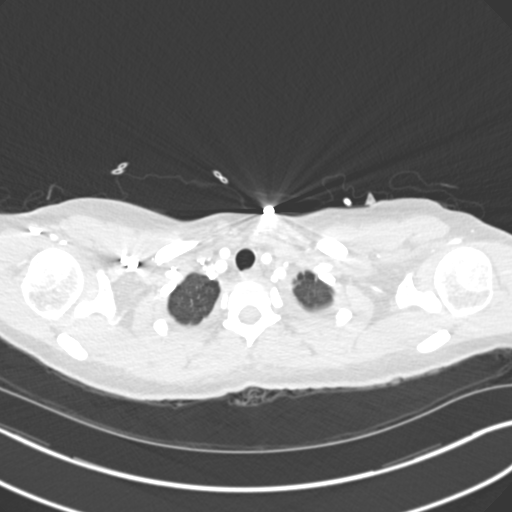
[im 285/303  soft-tissue]
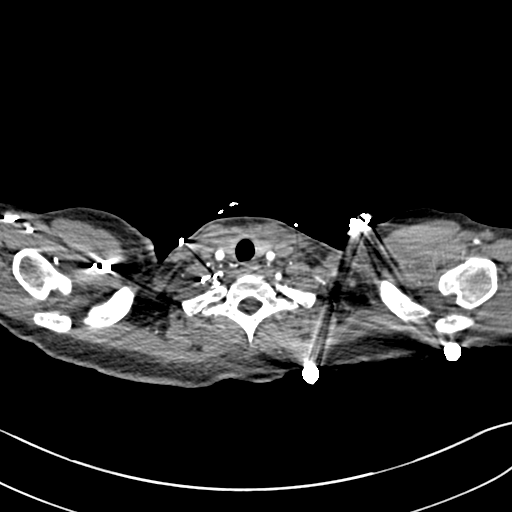

[Series 5: coronal mpr · coronal · 0.58mm/px · 3 of 142 slices shown]
[im 29/142  soft-tissue]
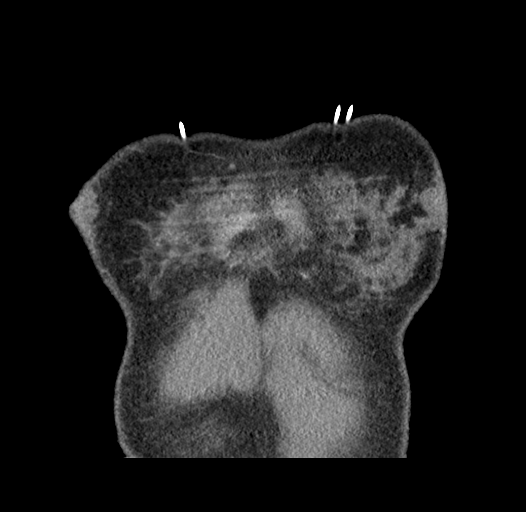
[im 57/142  soft-tissue]
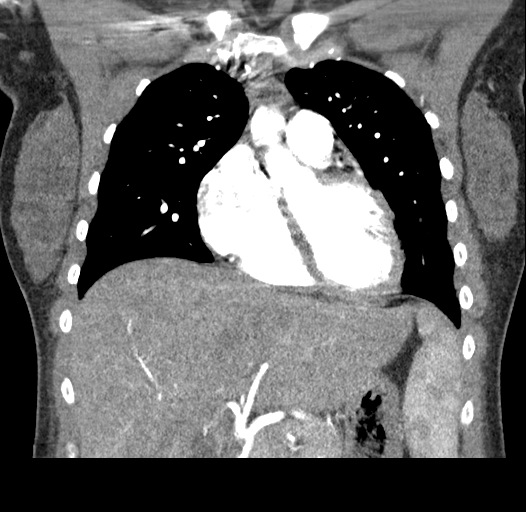
[im 85/142  soft-tissue]
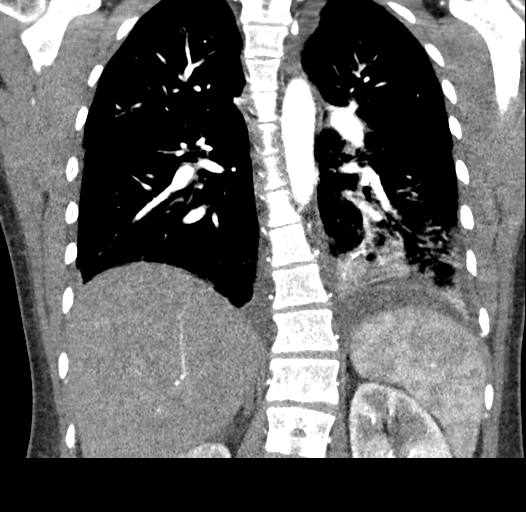

[19 of 46 positions shown; findings below may reference images not displayed]

FINDINGS: Cardiovascular: Satisfactory opacification of the pulmonary arteries
to the segmental level. No evidence of pulmonary embolism. Normal
heart size. No pericardial effusion.

Mediastinum/Nodes: Esophagus and thyroid are unremarkable. No
pathologically enlarged lymph nodes seen in the chest.

Lungs/Pleura: Central airways are patent. Bilateral lower lobe
consolidations and interlobular septal thickening. Small bilateral
pleural effusions.

Upper abdomen: No acute abnormality.

Musculoskeletal: No chest wall abnormality. No acute or significant
osseous findings.
IMPRESSION: 1. No evidence of pulmonary embolus.
2. Bilateral lower lobe consolidations, favored to be due to
pulmonary edema, infection or aspiration are additional
considerations.
3. Small bilateral pleural effusions.

## 2023-09-16 IMAGING — CT CT ABD-PELV W/ CM
1 series · 15 of 32 positions shown, 19 images · IV contrast (OMNIPAQUE)
Comparison: CT scan 02/13/2021

CLINICAL DATA: History of right-sided pyelonephritis with
persistent fevers and sepsis.

EXAM:
CT ABDOMEN AND PELVIS WITH CONTRAST
TECHNIQUE: Multidetector CT imaging of the abdomen and pelvis was performed
using the standard protocol following bolus administration of
intravenous contrast.
CONTRAST:  80mL OMNIPAQUE IOHEXOL 350 MG/ML SOLN

[Series 101: abd/ pelvis rtd · axial · 0.98mm/px · z∈[-511,-101]mm · 15 of 91 slices shown, 19 images]
[im 6/91  soft-tissue]
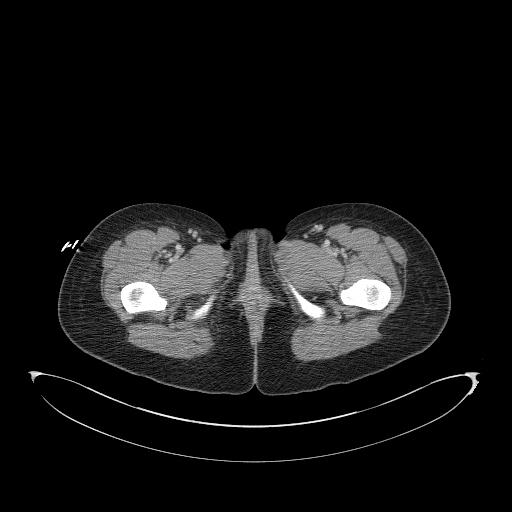
[im 6/91  bone]
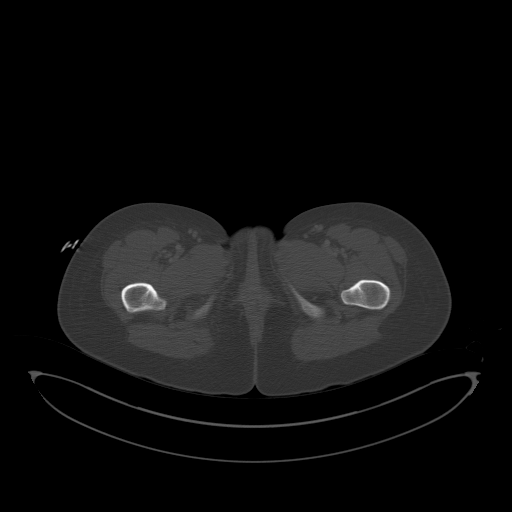
[im 12/91  soft-tissue]
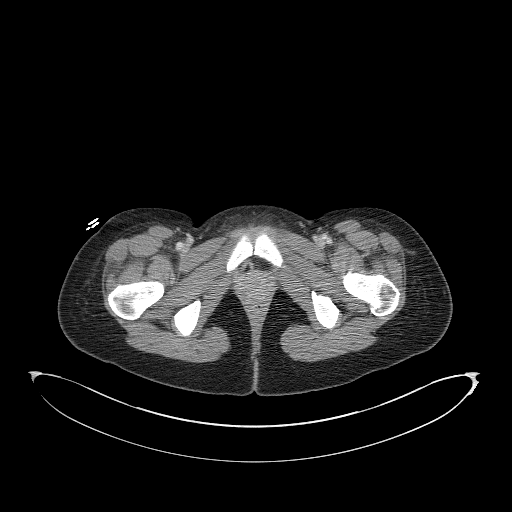
[im 18/91  soft-tissue]
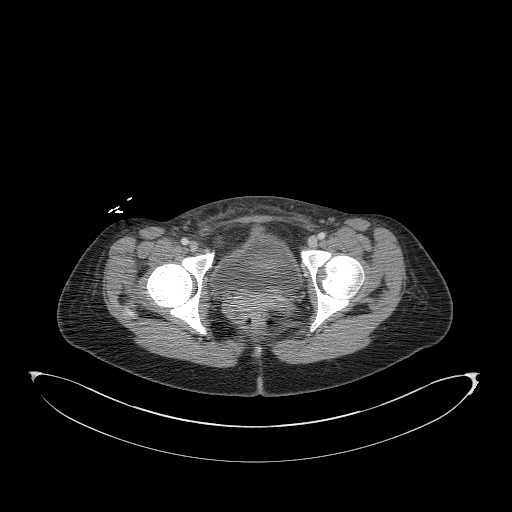
[im 27/91  soft-tissue]
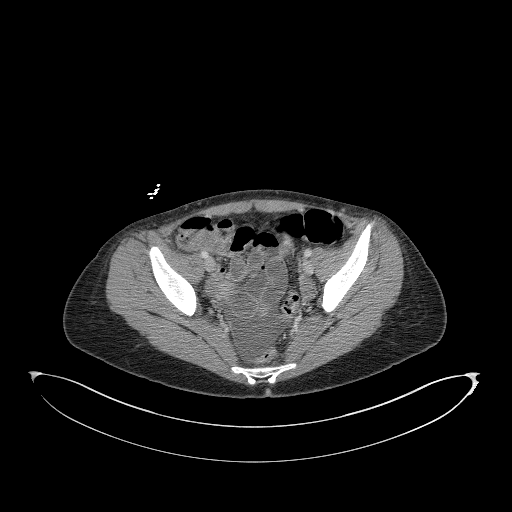
[im 32/91  soft-tissue]
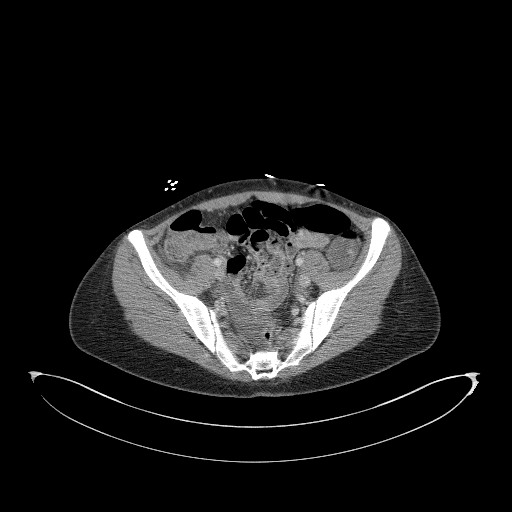
[im 38/91  soft-tissue]
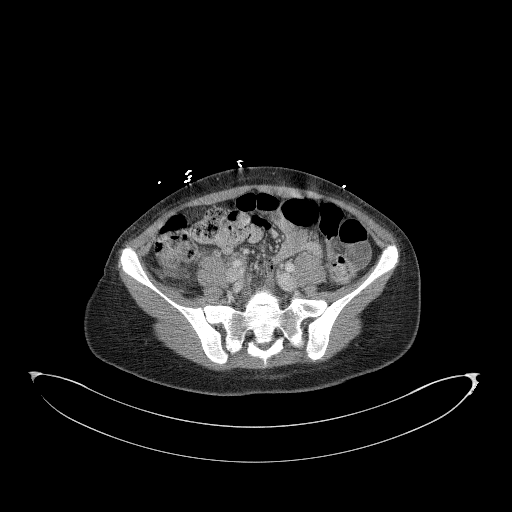
[im 47/91  soft-tissue]
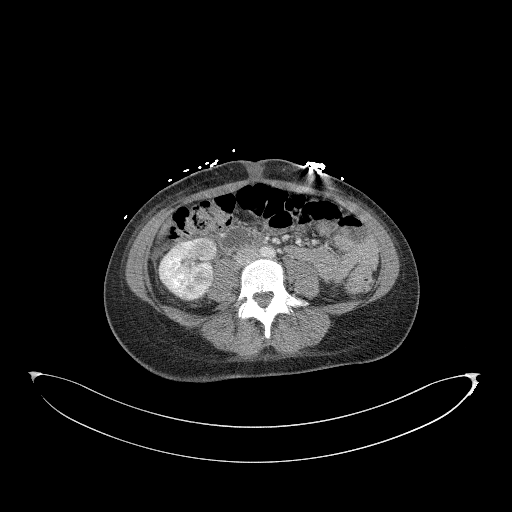
[im 53/91  soft-tissue]
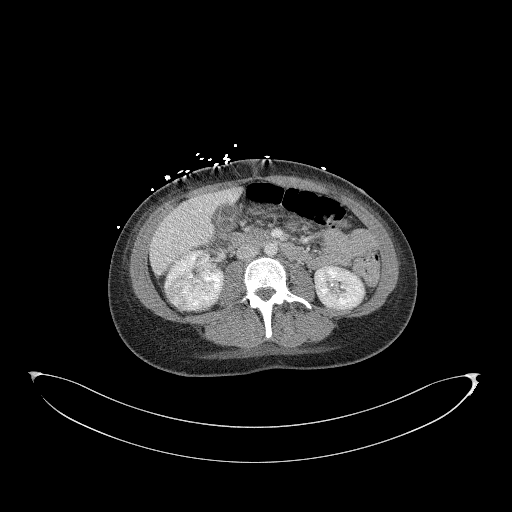
[im 59/91  soft-tissue]
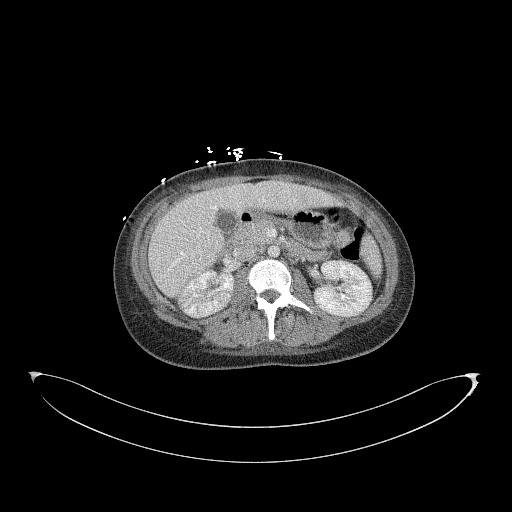
[im 59/91  bone]
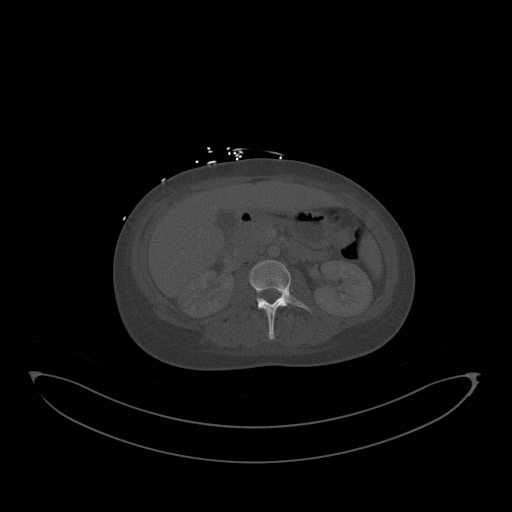
[im 64/91  soft-tissue]
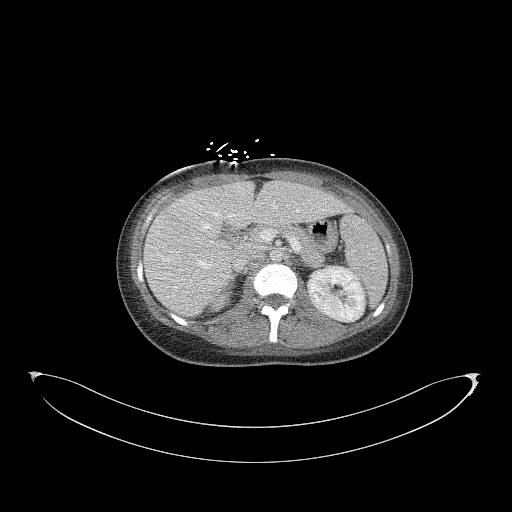
[im 73/91  soft-tissue]
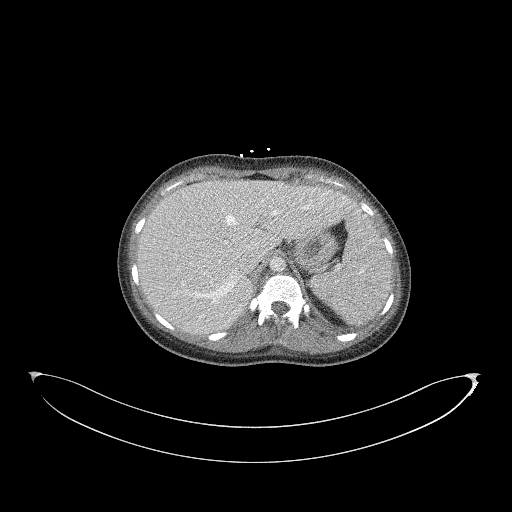
[im 79/91  soft-tissue]
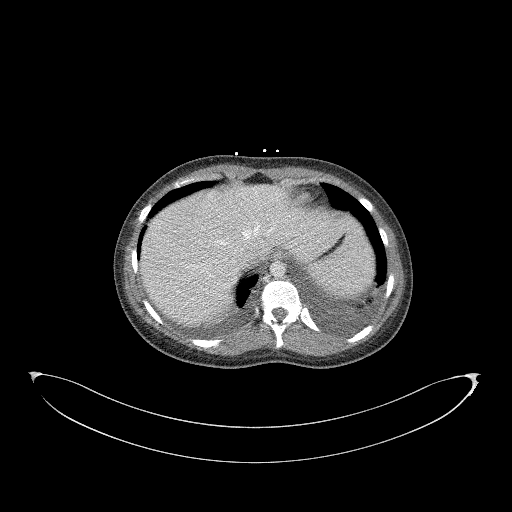
[im 79/91  lung]
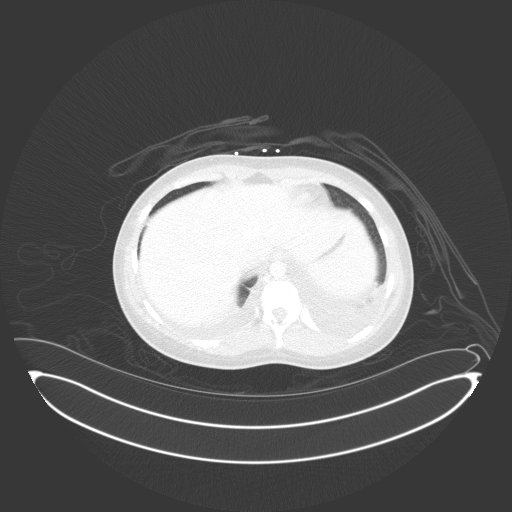
[im 82/91  lung]
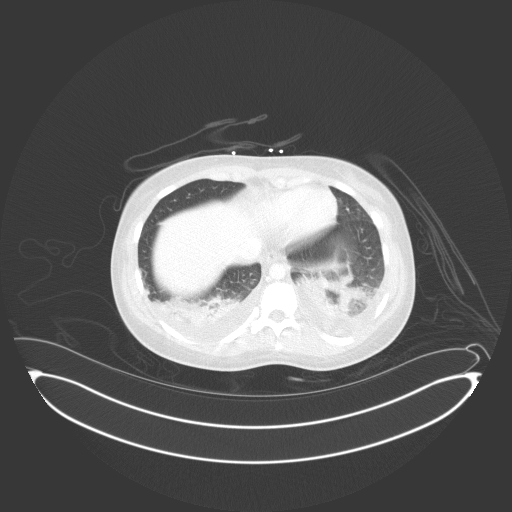
[im 85/91  soft-tissue]
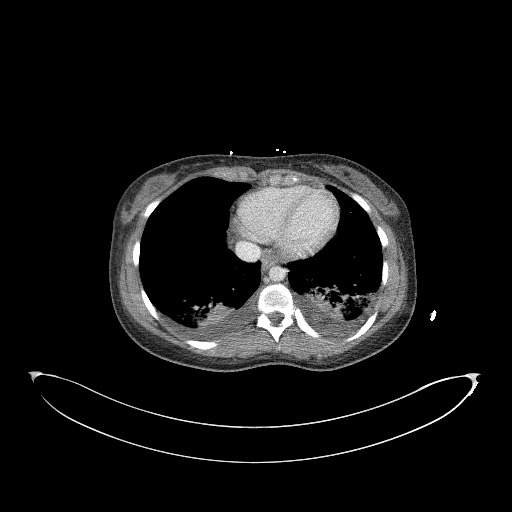
[im 85/91  lung]
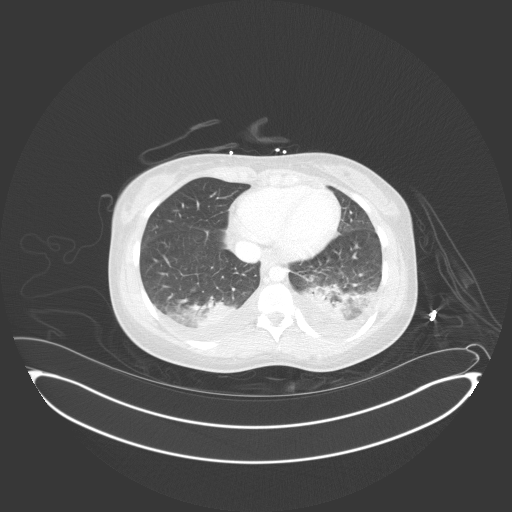
[im 88/91  lung]
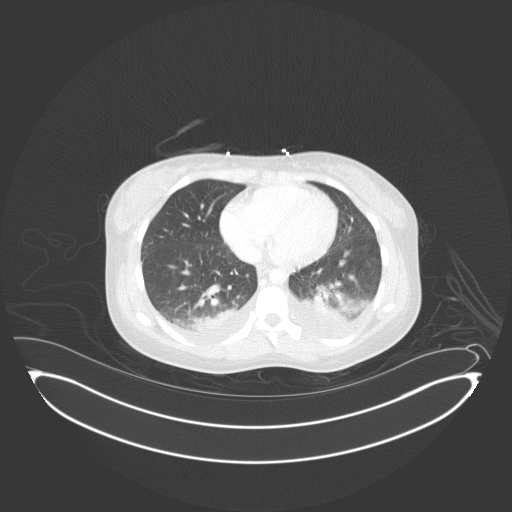

[15 of 32 positions shown; findings below may reference images not displayed]

FINDINGS: Unfortunately, the scanner went down during the examination and we
were able to recover only real-time axial images.

Lower chest: There are new small bilateral pleural effusions with
overlying atelectasis or basilar infiltrates.

Hepatobiliary: No hepatic lesions or intrahepatic biliary
dilatation. There is fluid around the gallbladder but I do not see
any definite wall thickening.

Pancreas: No significant findings.

Spleen: Normal size.

Adrenals/Urinary Tract: The adrenal glands are unremarkable and
stable.

Persistent changes of pyelonephritis involving the right kidney. I
do not see a discrete rim enhancing abscess. Fluid and inflammation
around the kidney persists. The bladder is grossly normal. The left
kidney is unremarkable and stable.

Stomach/Bowel: The stomach, duodenum, small bowel and colon are
grossly normal.

Vascular/Lymphatic: The aorta and branch vessels are normal. The
major venous structures are patent. Small scattered mesenteric and
retroperitoneal nodes likely reactive.

Reproductive: The uterus and ovaries are unremarkable.

Other: Scattered fluid in the abdomen noted mainly around the
gallbladder, around the right kidney, in the right pericolic gutter
and coming down into the pelvis where there is free fluid in the
cul-de-sac. There appears to be some mild peritoneal enhancement in
findings are suspicious for peritonitis. PID would be another
possibility if the patient has any symptoms to correlate with that.

Musculoskeletal: No significant bony findings.
IMPRESSION: 1. Persistent changes of pyelonephritis involving the right kidney.
No discrete rim enhancing abscess.
2. New small bilateral pleural effusions with overlying atelectasis
or basilar infiltrates.
3. Scattered fluid in the abdomen and pelvis with mild peritoneal
enhancement suspicious for peritonitis.

## 2023-09-28 DIAGNOSIS — Z419 Encounter for procedure for purposes other than remedying health state, unspecified: Secondary | ICD-10-CM | POA: Diagnosis not present

## 2023-10-29 DIAGNOSIS — Z419 Encounter for procedure for purposes other than remedying health state, unspecified: Secondary | ICD-10-CM | POA: Diagnosis not present

## 2023-11-29 DIAGNOSIS — Z419 Encounter for procedure for purposes other than remedying health state, unspecified: Secondary | ICD-10-CM | POA: Diagnosis not present

## 2023-12-29 DIAGNOSIS — Z419 Encounter for procedure for purposes other than remedying health state, unspecified: Secondary | ICD-10-CM | POA: Diagnosis not present

## 2024-02-25 DIAGNOSIS — Z01419 Encounter for gynecological examination (general) (routine) without abnormal findings: Secondary | ICD-10-CM | POA: Diagnosis not present

## 2024-02-25 DIAGNOSIS — Z1389 Encounter for screening for other disorder: Secondary | ICD-10-CM | POA: Diagnosis not present

## 2024-02-25 DIAGNOSIS — R350 Frequency of micturition: Secondary | ICD-10-CM | POA: Diagnosis not present

## 2024-02-25 DIAGNOSIS — Z3009 Encounter for other general counseling and advice on contraception: Secondary | ICD-10-CM | POA: Diagnosis not present

## 2024-02-28 DIAGNOSIS — Z419 Encounter for procedure for purposes other than remedying health state, unspecified: Secondary | ICD-10-CM | POA: Diagnosis not present

## 2024-03-09 DIAGNOSIS — F909 Attention-deficit hyperactivity disorder, unspecified type: Secondary | ICD-10-CM | POA: Diagnosis not present

## 2024-03-09 DIAGNOSIS — F411 Generalized anxiety disorder: Secondary | ICD-10-CM | POA: Diagnosis not present

## 2024-03-09 DIAGNOSIS — F429 Obsessive-compulsive disorder, unspecified: Secondary | ICD-10-CM | POA: Diagnosis not present

## 2024-03-09 DIAGNOSIS — F331 Major depressive disorder, recurrent, moderate: Secondary | ICD-10-CM | POA: Diagnosis not present

## 2024-04-21 ENCOUNTER — Ambulatory Visit: Admitting: Family Medicine
